# Patient Record
Sex: Female | Born: 1960 | Race: White | Hispanic: No | Smoking: Never smoker
Health system: Southern US, Community
[De-identification: ages and names within clinical notes are randomized; demographics above are authoritative.]

## PROBLEM LIST (undated history)

## (undated) DIAGNOSIS — N6019 Diffuse cystic mastopathy of unspecified breast: Secondary | ICD-10-CM

## (undated) DIAGNOSIS — G43909 Migraine, unspecified, not intractable, without status migrainosus: Secondary | ICD-10-CM

## (undated) DIAGNOSIS — R112 Nausea with vomiting, unspecified: Secondary | ICD-10-CM

## (undated) DIAGNOSIS — Z8619 Personal history of other infectious and parasitic diseases: Secondary | ICD-10-CM

## (undated) DIAGNOSIS — I7300 Raynaud's syndrome without gangrene: Secondary | ICD-10-CM

## (undated) DIAGNOSIS — Z9889 Other specified postprocedural states: Secondary | ICD-10-CM

## (undated) DIAGNOSIS — T8859XA Other complications of anesthesia, initial encounter: Secondary | ICD-10-CM

## (undated) HISTORY — DX: Diffuse cystic mastopathy of unspecified breast: N60.19

## (undated) HISTORY — DX: Personal history of other infectious and parasitic diseases: Z86.19

## (undated) HISTORY — PX: TOTAL ABDOMINAL HYSTERECTOMY: SHX209

## (undated) HISTORY — DX: Raynaud's syndrome without gangrene: I73.00

## (undated) HISTORY — PX: PELVIC LAPAROSCOPY: SHX162

## (undated) HISTORY — DX: Migraine, unspecified, not intractable, without status migrainosus: G43.909

---

## 1998-03-08 ENCOUNTER — Other Ambulatory Visit: Admission: RE | Admit: 1998-03-08 | Discharge: 1998-03-08 | Payer: Self-pay | Admitting: Obstetrics and Gynecology

## 1999-10-29 HISTORY — PX: HYSTEROSCOPY: SHX211

## 2000-05-06 ENCOUNTER — Encounter: Payer: Self-pay | Admitting: *Deleted

## 2000-05-06 ENCOUNTER — Ambulatory Visit (HOSPITAL_COMMUNITY): Admission: RE | Admit: 2000-05-06 | Discharge: 2000-05-06 | Payer: Self-pay | Admitting: *Deleted

## 2000-05-09 ENCOUNTER — Other Ambulatory Visit: Admission: RE | Admit: 2000-05-09 | Discharge: 2000-05-09 | Payer: Self-pay | Admitting: Obstetrics and Gynecology

## 2000-07-07 ENCOUNTER — Inpatient Hospital Stay (HOSPITAL_COMMUNITY): Admission: RE | Admit: 2000-07-07 | Discharge: 2000-07-09 | Payer: Self-pay | Admitting: Obstetrics and Gynecology

## 2001-10-07 ENCOUNTER — Emergency Department (HOSPITAL_COMMUNITY): Admission: EM | Admit: 2001-10-07 | Discharge: 2001-10-07 | Payer: Self-pay | Admitting: Emergency Medicine

## 2001-10-07 ENCOUNTER — Encounter: Payer: Self-pay | Admitting: Emergency Medicine

## 2001-11-26 ENCOUNTER — Ambulatory Visit (HOSPITAL_COMMUNITY): Admission: RE | Admit: 2001-11-26 | Discharge: 2001-11-26 | Payer: Self-pay | Admitting: Gastroenterology

## 2004-11-20 ENCOUNTER — Encounter: Admission: RE | Admit: 2004-11-20 | Discharge: 2004-11-20 | Payer: Self-pay | Admitting: General Surgery

## 2004-12-04 ENCOUNTER — Encounter: Admission: RE | Admit: 2004-12-04 | Discharge: 2004-12-04 | Payer: Self-pay | Admitting: General Surgery

## 2005-11-07 ENCOUNTER — Encounter: Admission: RE | Admit: 2005-11-07 | Discharge: 2005-11-07 | Payer: Self-pay | Admitting: Obstetrics and Gynecology

## 2006-11-26 ENCOUNTER — Encounter: Admission: RE | Admit: 2006-11-26 | Discharge: 2006-11-26 | Payer: Self-pay | Admitting: Obstetrics and Gynecology

## 2012-10-28 DIAGNOSIS — Z8619 Personal history of other infectious and parasitic diseases: Secondary | ICD-10-CM

## 2012-10-28 HISTORY — DX: Personal history of other infectious and parasitic diseases: Z86.19

## 2012-12-25 ENCOUNTER — Other Ambulatory Visit: Payer: Self-pay | Admitting: Physician Assistant

## 2012-12-25 DIAGNOSIS — R51 Headache: Secondary | ICD-10-CM

## 2012-12-30 ENCOUNTER — Inpatient Hospital Stay: Admission: RE | Admit: 2012-12-30 | Payer: Self-pay | Source: Ambulatory Visit

## 2013-01-05 ENCOUNTER — Other Ambulatory Visit: Payer: Self-pay

## 2013-01-07 ENCOUNTER — Ambulatory Visit
Admission: RE | Admit: 2013-01-07 | Discharge: 2013-01-07 | Disposition: A | Discharge status: Home / Self Care | Payer: No Typology Code available for payment source | Source: Ambulatory Visit | Attending: Physician Assistant | Admitting: Physician Assistant

## 2013-01-07 DIAGNOSIS — R51 Headache: Secondary | ICD-10-CM

## 2013-02-23 ENCOUNTER — Ambulatory Visit (INDEPENDENT_AMBULATORY_CARE_PROVIDER_SITE_OTHER): Payer: No Typology Code available for payment source | Admitting: Gynecology

## 2013-02-23 ENCOUNTER — Encounter: Payer: Self-pay | Admitting: Gynecology

## 2013-02-23 VITALS — BP 110/70 | HR 68 | Resp 16 | Ht 64.5 in | Wt 104.0 lb

## 2013-02-23 DIAGNOSIS — Z0000 Encounter for general adult medical examination without abnormal findings: Secondary | ICD-10-CM

## 2013-02-23 DIAGNOSIS — Z1211 Encounter for screening for malignant neoplasm of colon: Secondary | ICD-10-CM

## 2013-02-23 DIAGNOSIS — G43909 Migraine, unspecified, not intractable, without status migrainosus: Secondary | ICD-10-CM | POA: Insufficient documentation

## 2013-02-23 DIAGNOSIS — N6019 Diffuse cystic mastopathy of unspecified breast: Secondary | ICD-10-CM | POA: Insufficient documentation

## 2013-02-23 DIAGNOSIS — Z13820 Encounter for screening for osteoporosis: Secondary | ICD-10-CM

## 2013-02-23 DIAGNOSIS — Z01419 Encounter for gynecological examination (general) (routine) without abnormal findings: Secondary | ICD-10-CM

## 2013-02-23 DIAGNOSIS — I7300 Raynaud's syndrome without gangrene: Secondary | ICD-10-CM | POA: Insufficient documentation

## 2013-02-23 LAB — POCT URINALYSIS DIPSTICK
Blood, UA: NEGATIVE
Glucose, UA: NEGATIVE
Nitrite, UA: NEGATIVE
Protein, UA: NEGATIVE
Spec Grav, UA: 1.02
pH, UA: 6

## 2013-02-23 MED ORDER — ESTRADIOL 0.1 MG/24HR TD PTTW: 1.0000 | MEDICATED_PATCH | TRANSDERMAL | Status: DC

## 2013-02-23 NOTE — Progress Notes (Signed)
52 y.o.  Married  Caucasian female  G0  here for annual exam.  Pt is currently taking HRT, would like to continue.  Denies dyspareunia, reports regualr BSE but has had extensive fibrocystic disease with multiple drainage of cysts  No LMP recorded.          Sexually active: yes  The current method of family planning is post menopausal status.    Exercising: walk, weight Last mammogram: 10/2012  Last pap smear: History of abnormal pap: no Smoking: no Alcohol: no Last colonoscopy: never Last Bone Density:  2007 Last tetanus shot: 2012 Last cholesterol check:  BSE:  yes  Hgb:                Urine:    No health maintenance topics applied.  Family History  Problem Relation Age of Onset  . Parkinson's disease Father     Patient Active Problem List   Diagnosis Date Noted  . Migraine   . Raynaud's disease   . Fibrocystic breast disease     Past Medical History  Diagnosis Date  . Migraine     Past Surgical History  Procedure Laterality Date  . Hysteroscopy  2001    BSO- endometriosis  . Oophorectomy Bilateral     with TAH  . Pelvic laparoscopy  3x     endometriosis, LOA    Allergies: Codeine  No current outpatient prescriptions on file.   No current facility-administered medications for this visit.    ROS: Pertinent items are noted in HPI.  Social Hx:    Exam:    There were no vitals taken for this visit.   Wt Readings from Last 3 Encounters:  No data found for Wt     Ht Readings from Last 3 Encounters:  No data found for Ht    General appearance: alert, cooperative and appears stated age Head: Normocephalic, without obvious abnormality, atraumatic Neck: no adenopathy, supple, symmetrical, trachea midline and thyroid not enlarged, symmetric, no tenderness/mass/nodules Lungs: clear to auscultation bilaterally Breasts: Inspection negative, No nipple retraction or dimpling, No nipple discharge or bleeding, No axillary or supraclavicular adenopathy, Normal  to palpation without dominant masses Heart: regular rate and rhythm Abdomen: soft, non-tender; bowel sounds normal; no masses,  no organomegaly Extremities: extremities normal, atraumatic, no cyanosis or edema Skin: Skin color, texture, turgor normal. No rashes or lesions Lymph nodes: Cervical, supraclavicular, and axillary nodes normal. No abnormal inguinal nodes palpated Neurologic: Grossly normal   Pelvic: External genitalia:  no lesions              Urethra:  normal appearing urethra with no masses, tenderness or lesions              Bartholins and Skenes: normal                 Vagina: normal appearing vagina with normal color and discharge, no lesions              Cervix: absent              Pap taken: no        Bimanual Exam:  Uterus:  absent                                      Adnexa: absent but nodularity on right palpated  Rectovaginal: Confirms, no fullness on right                                      Anus:  normal sphincter tone, no lesions  A: normal gyn exam Severe fibrocystic breast disease Postmenopausal female, surgical, 13y on HRT     P: mammogram diagnostic due 7/14, last exam on right 01/2102, suggest pt get repeat mammo in 27m, pt has seen surgery in past, multiple drainage, reports no benefit with discontinuing caffiene, dsicussed 3d mammo counseled on use and side effects of HRT adequate intake of calcium and vitamin D, refill vivelle dot only, d/c progestin right sided fullness, no symptoms, pt will rto for recheck in 81m Overdue for colonoscopy-referral Last DEXA 7y ago-refer Will rto for fasting labs Suggest pt see gen surgeon re severe fibrocystic dz return annually or prn     An After Visit Summary was printed and given to the patient.

## 2013-02-23 NOTE — Patient Instructions (Addendum)

## 2013-02-25 ENCOUNTER — Telehealth: Payer: Self-pay | Admitting: Orthopedic Surgery

## 2013-02-25 ENCOUNTER — Telehealth: Payer: Self-pay | Admitting: Gynecology

## 2013-02-25 DIAGNOSIS — N6012 Diffuse cystic mastopathy of left breast: Secondary | ICD-10-CM

## 2013-02-25 NOTE — Telephone Encounter (Signed)
Called pt to discuss referral back to general surgeon, pt had seen Dr Maryagnes Amos in past but never follwed up with new surgeon after his retirement.  Pt has svereve fibrocystic breast disease and has had multiple imaging studies, I recommend she discuss alternative therapies with GS, cyst are recurrent, painful and multpile recalls are stressful. Questions addressed, she is agreeable and we will make that referral for her

## 2013-02-25 NOTE — Telephone Encounter (Signed)
LMTCB    (Need to tell her about 2 upcoming appts. Dr. Loreta Ave consult 03-02-13 at 2:45 pm. Dr. Derrell Lolling consult at [facility] 03-08-13 arriving at 4:30 for a 5:00 appt.)

## 2013-02-26 ENCOUNTER — Other Ambulatory Visit: Payer: Self-pay | Admitting: Obstetrics and Gynecology

## 2013-02-26 NOTE — Telephone Encounter (Signed)
Patient wants advice from Dr. Tresa Res re: progesterone. Dr. Farrel Gobble took patient off of it and patient wants Dr. Harlene Salts advice and wants to go back on it. Patient requests rx for progesterone be called into [facility] at [facility]/ Patient also wants to speak with Dr. Tresa Res about Dr. Farrel Gobble suggesting patient have a double mastectomy because of "lumpy breasts" patient has had for years. Patient states her schedule does not permit her to come in for a consultation but does want Dr. Harlene Salts advice.

## 2013-02-26 NOTE — Telephone Encounter (Signed)
I will need her paper chart.  Please put it on my desk.

## 2013-02-26 NOTE — Telephone Encounter (Signed)
Please advise on note for refill and patient question.

## 2013-03-02 NOTE — Telephone Encounter (Signed)
Filled on 02/23/13

## 2013-03-03 ENCOUNTER — Telehealth: Payer: Self-pay | Admitting: Obstetrics and Gynecology

## 2013-03-03 NOTE — Telephone Encounter (Signed)
Called numbers in EPIC. Left message on home # of spouse home # listed of need to return call to our office with response of Dr.   Tresa Res. And referrals.

## 2013-03-03 NOTE — Telephone Encounter (Signed)
Dr. Farrel Gobble was trying to be highly careful re: breast cancer PREVENTION by sending pt for a surgical opinion.  I think it would be ok to forgo the surgical referral for now and just do good self exams, have me check her breasts every year, and get yearly mammograms.  She certainly does need the referral for colonoscopy for screening.

## 2013-03-03 NOTE — Telephone Encounter (Signed)
Patient called concerning phone call as to why she needed to see a surgeon when she does not have breast cancer.  See OV note for 02/23/2013 with Dr. Farrel Gobble. Patient states she never received any messages of appointment date and time for Dr. Loreta Ave or for [facility]. Patient states she does not want to see any surgeon till you have reviewed her chart and have your in put on this matter. Please advise. Fannie Knee. Unable to loacate paper chart . Telephone note printed for 02/25/2013. In your cabinet.

## 2013-03-04 ENCOUNTER — Telehealth: Payer: Self-pay | Admitting: Orthopedic Surgery

## 2013-03-04 NOTE — Telephone Encounter (Signed)
Pt missed screening colonoscopy consult appt on 03-02-13  because she could not be contacted. Got another appt from scheduler for 03-16-13 at 1:30 pm.

## 2013-03-04 NOTE — Telephone Encounter (Signed)
LMTCB about upcoming screening colonoscopy appt.

## 2013-03-05 ENCOUNTER — Ambulatory Visit: Payer: Self-pay | Admitting: Obstetrics and Gynecology

## 2013-03-05 NOTE — Telephone Encounter (Signed)
Patient called and was given information from Dr. Tresa Res regarding surgical referral and colonoscopy. Patient understands this and has called and gotten a colonoscopy consult set up.

## 2013-03-05 NOTE — Telephone Encounter (Signed)
Patient returning Sue's call.

## 2013-03-08 ENCOUNTER — Ambulatory Visit (INDEPENDENT_AMBULATORY_CARE_PROVIDER_SITE_OTHER): Payer: Self-pay | Admitting: General Surgery

## 2013-03-09 ENCOUNTER — Telehealth: Payer: Self-pay | Admitting: Orthopedic Surgery

## 2013-03-09 NOTE — Telephone Encounter (Signed)
LM on pt cell phone about appt at Dr. Kenna Gilbert office 03-16-13 at 1:30 pm for consult. Gave phone number to reschedule if needed. Pt to call back if any questions.

## 2013-03-17 ENCOUNTER — Other Ambulatory Visit: Payer: Self-pay | Admitting: Obstetrics and Gynecology

## 2013-03-18 NOTE — Telephone Encounter (Signed)
Patient seen on 02/23/13 ok to fill for Prometrium?

## 2013-03-19 ENCOUNTER — Other Ambulatory Visit: Payer: Self-pay | Admitting: Obstetrics and Gynecology

## 2013-03-19 NOTE — Telephone Encounter (Signed)
Dr. Farrel Gobble saw her on 02/23/13 ok to fill no current lab result or fill done during annual.

## 2013-04-07 ENCOUNTER — Telehealth: Payer: Self-pay | Admitting: *Deleted

## 2013-04-07 MED ORDER — PROGESTERONE MICRONIZED 100 MG PO CAPS: 100.0000 mg | ORAL_CAPSULE | Freq: Every day | ORAL | Status: DC

## 2013-04-07 NOTE — Telephone Encounter (Signed)
Per paper chart, was previously on Minivelle 0.1mg  twice week and Prometrium 100 mg OD (from CR annual 01-2012).  Saw DrLathrop 01-2013 and Prometrium was discontinued History of endometriosis and hysterectomy.

## 2013-04-07 NOTE — Telephone Encounter (Signed)
Please renew for patient through AEX.  She may need to see me next year.  Can you clarify why she's had trouble getting a response?

## 2013-04-07 NOTE — Telephone Encounter (Signed)
Patient states she has called multiple times without response.  Wants to restart Progesterone that Dr Farrel Gobble took her off of.  She had been on it for years from dr Tresa Res and did great but dr Farrel Gobble took her off and now she is having trouble with migraines and difficulty sleeping.  Please advise if ok to restart

## 2013-04-07 NOTE — Telephone Encounter (Signed)
Patient notified RX will be sent to pharmacy till next AEX.  Notified of Dr [employer] and will sched AEX with Dr Hyacinth Meeker.

## 2013-07-26 ENCOUNTER — Telehealth: Payer: Self-pay | Admitting: Obstetrics & Gynecology

## 2013-07-26 NOTE — Telephone Encounter (Signed)
pt was referred by Dr. Farrel Gobble and has an appointment tomorrow @ [facility] they need to know where she had her mammograms done at and she also needs notes.

## 2013-07-27 ENCOUNTER — Ambulatory Visit (INDEPENDENT_AMBULATORY_CARE_PROVIDER_SITE_OTHER): Payer: No Typology Code available for payment source | Admitting: General Surgery

## 2013-07-27 ENCOUNTER — Encounter (INDEPENDENT_AMBULATORY_CARE_PROVIDER_SITE_OTHER): Payer: Self-pay | Admitting: General Surgery

## 2013-07-27 VITALS — BP 110/72 | HR 82 | Temp 98.2°F | Resp 14 | Ht 65.0 in | Wt 106.6 lb

## 2013-07-27 DIAGNOSIS — N6019 Diffuse cystic mastopathy of unspecified breast: Secondary | ICD-10-CM

## 2013-07-27 NOTE — Progress Notes (Signed)
Patient ID: Shannon Foster, female   DOB: 1961-04-16, 52 y.o.   MRN: [id number]  Chief Complaint  Patient presents with  . New Evaluation    eval lt br fibrocystic disease for poss masty    HPI Shannon Foster is a 52 y.o. female.  She is referred by Dr. Douglass Rivers for evaluation of bilateral fibrocystic disease.  This patient is generally healthy. She has a long history of bilateral breast cysts. Dr. Maryagnes Amos aspirated these numerous times in the past. She has not had any breast cyst aspirations lately. She has seen Dr. Luisa Hart on one occasion for fibrocystic changes well. She does not have any pain. She is not having nipple discharge. She states that she has lumps that come and go but none that are progressive or symptomatic.  Imaging studies show that her breasts are very dense and there are stable, diffuse, presumably benign calcifications in both breasts. Her last mammogram dated 11/11/2012 showed that there was a cluster of microcalcifications in the upper-outer quadrant left breast which were thought to be stable. Six-month followup was recommended. When they called she declined the six-month followup. She is concerned about radiation exposure and the numerous mammograms and she has received. She has never had a breast biopsy.  She states that she and Dr. Farrel Gobble have discussed whether or not prophylactic mastectomy would be a good option. She is not interested in mastectomy unless it is indicated. She does not appear to be fearful or phobic regarding occult breast cancer, but does want to know what the right thing to do this.  Family history is negative for breast cancer or ovarian cancer. This includes her mother, grandmother, and 2 sisters.  Past history is negative for radiation therapy. She takes progesterone, estradiol, Prilosec, Elavil, and Maxalt for migraines.  HPI  Past Medical History  Diagnosis Date  . Migraine   . Raynaud's disease   . Fibrocystic breast disease      Past Surgical History  Procedure Laterality Date  . Hysteroscopy  2001    BSO- endometriosis  . Oophorectomy Bilateral     with TAH  . Pelvic laparoscopy  3x     endometriosis, LOA  . Abdominal hysterectomy      Family History  Problem Relation Age of Onset  . Parkinson's disease Father   . Hypertension Mother   . Cancer Brother     prostate    Social History History  Substance Use Topics  . Smoking status: Never Smoker   . Smokeless tobacco: Never Used  . Alcohol Use: No    Allergies  Allergen Reactions  . Codeine Nausea And Vomiting    Current Outpatient Prescriptions  Medication Sig Dispense Refill  . amitriptyline (ELAVIL) 50 MG tablet       . estradiol (MINIVELLE) 0.1 MG/24HR Place 1 patch (0.1 mg total) onto the skin 2 (two) times a week.  8 patch  12  . omeprazole (PRILOSEC) 40 MG capsule       . progesterone (PROMETRIUM) 100 MG capsule Take 1 capsule (100 mg total) by mouth at bedtime.  90 capsule  3  . Vitamin D, Ergocalciferol, (DRISDOL) 50000 UNITS CAPS TAKE 1 CAPSULE BY MOUTH EVERY OTHER WEEK  26 capsule  0  . rizatriptan (MAXALT) 10 MG tablet        No current facility-administered medications for this visit.    Review of Systems Review of Systems  Constitutional: Negative for fever, chills and unexpected weight change.  HENT:  Negative for hearing loss, congestion, sore throat, trouble swallowing and voice change.   Eyes: Negative for visual disturbance.  Respiratory: Negative for cough and wheezing.   Cardiovascular: Negative for chest pain, palpitations and leg swelling.  Gastrointestinal: Negative for nausea, vomiting, abdominal pain, diarrhea, constipation, blood in stool, abdominal distention and anal bleeding.  Genitourinary: Negative for hematuria, vaginal bleeding and difficulty urinating.  Musculoskeletal: Negative for arthralgias.  Skin: Negative for rash and wound.  Neurological: Negative for seizures, syncope and headaches.   Hematological: Negative for adenopathy. Does not bruise/bleed easily.  Psychiatric/Behavioral: Negative for confusion.    Blood pressure 110/72, pulse 82, temperature 98.2 F (36.8 C), temperature source Temporal, resp. rate 14, height 5\' 5"  (1.651 m), weight 106 lb 9.6 oz (48.353 kg).  Physical Exam Physical Exam  Constitutional: She is oriented to person, place, and time. She appears well-developed and well-nourished. No distress.  10. Healthy appearing. Alert.  HENT:  Head: Normocephalic and atraumatic.  Nose: Nose normal.  Mouth/Throat: No oropharyngeal exudate.  Eyes: Conjunctivae and EOM are normal. Pupils are equal, round, and reactive to light. Left eye exhibits no discharge. No scleral icterus.  Neck: Neck supple. No JVD present. No tracheal deviation present. No thyromegaly present.  Cardiovascular: Normal rate, regular rhythm, normal heart sounds and intact distal pulses.   No murmur heard. Pulmonary/Chest: Effort normal and breath sounds normal. No respiratory distress. She has no wheezes. She has no rales. She exhibits no tenderness.  Breasts are generally small. Symmetrical. Diffusely lumpy with a few smoothly marginated rounded  Masses. this is consistent with cysts. Nipple, areola, and skin is healthy. No axillary adenopathy. I did not perform ultrasound today.  Abdominal: Soft. Bowel sounds are normal. She exhibits no distension and no mass. There is no tenderness. There is no rebound and no guarding.  Musculoskeletal: She exhibits no edema and no tenderness.  Lymphadenopathy:    She has no cervical adenopathy.  Neurological: She is alert and oriented to person, place, and time. She exhibits normal muscle tone. Coordination normal.  Skin: Skin is warm. No rash noted. She is not diaphoretic. No erythema. No pallor.  Psychiatric: She has a normal mood and affect. Her behavior is normal. Judgment and thought content normal.  Good insight.    Data Reviewed Recent office  notes. Old office notes. Recent imaging studies.  Assessment    Fibrocystic breast disease, with a long history of multiple macrocysts.  Dense breast, this is an independent risk factor for breast cancer and slightly increases her risk   No other significant risk factors for breast cancer, although she is taking hormone replacement therapy    Plan    There is no indication for breast surgery at this point in time.  The biggest issue that I discussed with her was the difficulty in screening by physical exam and mammography because of her dense breast tissue. She is aware that her breast density is a risk factor for breast cancer.  I advised her to have mammography and bilateral breast MRIs sometime between now and January. She stated that she would do this in January.  She'll return to see me after the imaging studies are done in January for a breast exam and a discussion of findings.   She seems quite comfortable with this approach at this time.       Angelia Mould. Derrell Lolling, M.D., [facility] General and Minimally invasive Surgery Breast and Colorectal Surgery Office:   [phone] Pager:  [phone]  07/27/2013, 12:31 PM

## 2013-07-27 NOTE — Telephone Encounter (Signed)
Info faxed to [facility] for appt. Latest MMG's done at [facility].

## 2013-07-27 NOTE — Patient Instructions (Signed)
You have fibrocystic breast disease. The lumps that come and go are macrocysts which contained fluid.  Her mammogram shows which her breast tissue is very dense, and you have a diffuse benign appearing calcifications.  The dense breast tissue seen on mammogram is a slight risk factor for breast cancer, but he did not appear to have any other significant risks.  There is no indication for breast biopsy or other breast surgery at this point in time.  The biggest issue is the difficulty in screening for breast cancer considering your difficult physical exam and dense breasts on mammogram.  Dr. Derrell Lolling recommends that you get mammograms and  bilateral breast MRI in January.  Return to see Dr. Derrell Lolling in January.      Fibrocystic Breast Changes Fibrocystic breast changes is a non-cancerous(benign) condition that about half of all women have at some time in their life. It is also called benign breast disease and mammary dysplasia. It may also be called fibrocystic breast disease, but it is not really a disease. It is a common condition that occurs when women go through the hormonal changes during their menstrual cycle, between the ages of 42 to 22. Menopausal women do not have this problem, unless they are on hormone therapy. It can affect one or both breasts. This is not a sign that you will later get cancer. CAUSES  Overgrowth of cells lining the milk ducts, or enlarged lobules in the breast, cause the breast duct to become blocked. The duct then fills up with fluid. This is like a small balloon filled with water. It is called a cyst. Over time, with repeated inflammation there is a tendency to form scar tissue. This scar tissue becomes the fibrous part of fibrocystic disease. The exact cause of this happening is not known, but it may be related to the female hormones, estrogen and progesterone. Heredity (genetics) may also be a factor in some cases. SYMPTOMS   Tenderness.  Swelling.  Rope-like  feeling.  Lumpy breast, one or both sides.  Changes in the size of the breasts, before and after the menstrual period (larger before, smaller after).  Green or dark brown nipple discharge (not blood). Symptoms are usually worse before periods (menstrual cycle) and get better toward the end of menstruation. Usually, it is temporary minor discomfort. But some women have severe pain.  DIAGNOSIS  Check your breasts monthly. The best time to check your breasts is after your period. If you check them during your period, you are more likely to feel the normal glands enlarged, as a result of the hormonal changes that happen right before your period. If you do not have menstrual periods, check your breasts the first day of every month. Become familiar with the way your own breasts feel. It is then easier to notice if there are changes, such as more tenderness, a new growth, change in breast size, or a change in a lump that has always been there. All breasts lumps need to be investigated, to rule out breast cancer. See your caregiver as soon as possible, if you find a lump. Most breast lumps are not cancerous. Excellent treatment is available for ones that are.  To make a diagnosis, your caregiver will examine your breasts and may recommend other tests, such as:  Mammogram (breast X-ray).  Ultrasound.  MRI (magnetic resonance imaging).  Removing fluid from the cyst with a fine needle, under local anesthesia (aspiration).  Taking a breast tissue sample (breast biopsy). Some questions your caregiver  will ask are:  What was the date of your last period?  When did the lump show up?  Is there any discharge from your breast?  Is the breast tender or painful?  Are the symptoms in one or both breasts?  Has the lump changed in size from month-to-month? How long has it been present?  Any family history of breast problems?  Any past breast problems?  Any history of breast surgery?  Are you taking  any medications?  When was your last mammogram, and where was it done? TREATMENT   Dietary changes help to prevent or reduce the symptoms of fibrocystic breast changes.  You may need to stop consuming all foods that contain caffeine, such as chocolate, sodas, coffee, and tea.  Reducing sugar and fat in your diet may also help.  Decrease estrogen in your diet. Some sources include commercially raised meats which contain estrogen. Eliminate other natural estrogens.  Birth control pills can also make symptoms worse.  Natural progesterone cream, applied at a dose of 15 to 20 milligrams per day, from ovulation until a day or two before your period returns, may help with returning to normal breast tissue over several months. Seek advice from your caregiver.  Over-the-counter pain pills may help, as recommended by your caregiver.  Danazol hormone (female-like hormone) is sometimes used. It may cause hair growth and acne.  Needle aspiration can be used, to remove fluid from the cyst.  Surgery may be needed, to remove a large, persistent, and tender cyst.  Evening primrose oil may help with the tenderness and pain. It has linolenic acid that women may not have enough of. HOME CARE INSTRUCTIONS   Examine your breasts after every menstrual period.  If you do not have menstrual periods, examine your breasts the first day of every month.  Wear a firm support bra, especially when exercising.  Decrease or avoid caffeine in your diet.  Decrease the fat and sugar in your diet.  Eat a balanced diet.  Try to see your caregiver after you have a menstrual period.  Before seeing your caregiver, make notes about:  When you have the symptoms.  What types of symptoms you are having.  Medications you are taking.  When and where your last mammogram was taken.  Past breast problems or breast surgery. SEEK MEDICAL CARE IF:   You have been diagnosed with fibrocystic breast changes, and you  develop changes in your breast:  Discharge from the nipple, especially bloody discharge.  Pain in the breast that does not go away after your menstrual period.  New lumps or bumps in the breast.  Lumps in your armpit.  Your breast or breasts become enlarged, red, and painful.  You find an isolated lump, even if it is not tender.  You have questions about this condition that have not been answered. Document Released: 07/31/2006 Document Revised: 01/06/2012 Document Reviewed: 10/25/2009 [facility] Patient Information 2014 [place], [place].

## 2013-08-04 ENCOUNTER — Encounter (INDEPENDENT_AMBULATORY_CARE_PROVIDER_SITE_OTHER): Payer: Self-pay

## 2013-08-18 ENCOUNTER — Encounter (INDEPENDENT_AMBULATORY_CARE_PROVIDER_SITE_OTHER): Payer: Self-pay

## 2013-10-13 ENCOUNTER — Encounter (INDEPENDENT_AMBULATORY_CARE_PROVIDER_SITE_OTHER): Payer: Self-pay | Admitting: General Surgery

## 2013-11-15 ENCOUNTER — Other Ambulatory Visit (INDEPENDENT_AMBULATORY_CARE_PROVIDER_SITE_OTHER): Payer: Self-pay

## 2013-11-15 ENCOUNTER — Telehealth (INDEPENDENT_AMBULATORY_CARE_PROVIDER_SITE_OTHER): Payer: Self-pay | Admitting: *Deleted

## 2013-11-15 ENCOUNTER — Other Ambulatory Visit: Payer: Self-pay | Admitting: Ophthalmology

## 2013-11-15 DIAGNOSIS — G44009 Cluster headache syndrome, unspecified, not intractable: Secondary | ICD-10-CM

## 2013-11-15 DIAGNOSIS — N6019 Diffuse cystic mastopathy of unspecified breast: Secondary | ICD-10-CM

## 2013-11-15 NOTE — Addendum Note (Signed)
Addended byGweneth Fritter on: 11/15/2013 03:06 PM   Modules accepted: Orders

## 2013-11-15 NOTE — Telephone Encounter (Signed)
I spoke with pt and informed her of appt for her MM at [facility] on 11/17/13 with an arrival time of 2:30pm.  I also informed her of the appt for her breast MRI at [id number] on 11/18/13 with an arrival time of 7:15am.  I provided her with the phone numbers of [facility] and [facility].  I also informed her of her follow up appt with Dr. Dalbert Batman on 12/21/13 with an arrival time of 10:15am. She is agreeable with all information provided.

## 2013-11-18 ENCOUNTER — Other Ambulatory Visit: Payer: No Typology Code available for payment source

## 2013-11-24 ENCOUNTER — Other Ambulatory Visit: Payer: No Typology Code available for payment source

## 2013-11-24 ENCOUNTER — Inpatient Hospital Stay: Admission: RE | Admit: 2013-11-24 | Payer: No Typology Code available for payment source | Source: Ambulatory Visit

## 2013-12-21 ENCOUNTER — Ambulatory Visit (INDEPENDENT_AMBULATORY_CARE_PROVIDER_SITE_OTHER): Payer: No Typology Code available for payment source | Admitting: General Surgery

## 2013-12-27 ENCOUNTER — Telehealth: Payer: Self-pay | Admitting: *Deleted

## 2013-12-27 NOTE — Telephone Encounter (Signed)
Recall update:  Patient overdue for follow up MMG, Please contact her to schedule and notify me of date.

## 2013-12-28 NOTE — Telephone Encounter (Signed)
Left Message To Call Back  

## 2013-12-31 NOTE — Telephone Encounter (Signed)
Left Message To Call Back  

## 2014-01-04 NOTE — Telephone Encounter (Signed)
Left Message To Call Back  

## 2014-01-05 NOTE — Telephone Encounter (Signed)
Called patient x3 times and left messages no call back  Next step?

## 2014-01-11 NOTE — Telephone Encounter (Signed)
Have you received response?

## 2014-01-12 ENCOUNTER — Encounter: Payer: Self-pay | Admitting: Obstetrics & Gynecology

## 2014-01-14 NOTE — Telephone Encounter (Signed)
Letter sent per Dr Sabra Heck and recalls completed.  Routing to provider for final review. Patient agreeable to disposition. Will close encounter

## 2014-01-17 NOTE — Telephone Encounter (Signed)
Agree.  Encounter closed. 

## 2014-03-02 ENCOUNTER — Ambulatory Visit: Payer: No Typology Code available for payment source | Admitting: Gynecology

## 2014-03-03 ENCOUNTER — Encounter: Payer: Self-pay | Admitting: Obstetrics & Gynecology

## 2014-03-03 ENCOUNTER — Ambulatory Visit: Payer: Self-pay | Admitting: Obstetrics & Gynecology

## 2014-03-07 ENCOUNTER — Other Ambulatory Visit: Payer: Self-pay | Admitting: Gynecology

## 2014-03-07 NOTE — Telephone Encounter (Signed)
eScribe request from [facility] for refill on MINIVELLE Last filled - 02/23/13, #8 X 12 Last AEX - 02/23/13 Next AEX - not scheduled.  Pt no showed appt on 03/03/14 with Dr. Sabra Heck.  Last MMG - 11/11/12 per Dr Dalbert Batman note on 07/27/13.  Pt did not keep breast MRI appt on 11/18/13 that was scheduled by Dr. Darrel Hoover office.  Please advise refills.

## 2014-03-10 NOTE — Telephone Encounter (Signed)
Patient spouse is calling about the refill says that we can call her back at [phone] it is her cell phone number. Has aex scheduled for April 08, 2014

## 2014-03-11 NOTE — Telephone Encounter (Signed)
No refill if does not keep annual 6/15

## 2014-04-08 ENCOUNTER — Ambulatory Visit: Payer: Self-pay | Admitting: Obstetrics & Gynecology

## 2014-04-11 ENCOUNTER — Other Ambulatory Visit: Payer: Self-pay | Admitting: Obstetrics & Gynecology

## 2014-04-12 NOTE — Telephone Encounter (Signed)
Patient canceled and/or Nor show 3 times. Last refill 03/07/14. Last appt canceled 04/08/14. No more refills per Dr. Brion Aliment note.

## 2014-04-13 ENCOUNTER — Other Ambulatory Visit: Payer: Self-pay | Admitting: Gynecology

## 2014-04-13 ENCOUNTER — Other Ambulatory Visit: Payer: Self-pay | Admitting: Obstetrics & Gynecology

## 2014-04-13 ENCOUNTER — Telehealth: Payer: Self-pay | Admitting: Obstetrics & Gynecology

## 2014-04-13 NOTE — Telephone Encounter (Signed)
Please notify pt that I cannot refill RXs until she is seen.  She was advised to see Dr. Dalbert Batman for consult and didn't go.  They wrote her a letter regarding this.  She didn't go for her follow up MMG and I wrote her a letter about this.  Then she missed her June appointment.  I cannot write her prescription again until 1) she is seen in our office and 2) has her MMG.  She can be seen by anybody in the office and she can call and schedule her MMG as soon as her schedule allows.  I am sorry.

## 2014-04-13 NOTE — Telephone Encounter (Signed)
Patient's husband is calling regarding his wife's prescriptions. He said [facility] sent over a request and wife is out of her prescriptions could they be approved today. See refill requests.

## 2014-04-13 NOTE — Telephone Encounter (Signed)
Pt notified of Dr. Ammie Ferrier response.  Pt is very upset.  Pt states she was told last week when she called to reschedule appt, that getting a refill on her medication would not be an issue, she just needed to have her pharmacy call us.  Also discussed with patient that she is overdue for mammogram.  Pt states she went to Dr. Dalbert Batman and he agreed that she does not need frequent mammograms.  I read Dr. Ammie Ferrier note to patient that pt must be seen in our office and have mammogram before she can have refill.  Advised that she can see anyone in office.  Pt states mammogram can be done and she is going to discuss mammogram at time of AEX.  Pt states she will expect to see refills at pharmacy as she was told last week that getting refills would not be a problem and "this is not something to play with".  Advised pt that I could not make that decision and would have to discuss with someone above me.  Advised it could be Thursday before we contact her.  Pt is agreeable.  Discussed with Gay Filler and Dr. Quincy Simmonds and patient will be called Thursday morning.

## 2014-04-13 NOTE — Telephone Encounter (Signed)
eScribe request from [facility] for refill on [place] filled - 03/11/14, #8 X 0.  Per Dr. Charlies Constable no further refills if pt does not keep 03/2014 appt. Last AEX - 02/23/13 Next AEX - 08/05/14 Last MMG - 11/11/12, referred to Dr. Dalbert Batman for fibrocystic disease, but has not followed up as requested by him.  Pt DNKA on 03/03/14 to see Dr. Sabra Heck, pt cancelled 04/08/14 appt with Dr. Sabra Heck.   Please advise refills.

## 2014-04-13 NOTE — Telephone Encounter (Signed)
eScribe request from [facility] for refill on [place] Last filled - 04/07/13, #90 X 3 Last AEX - 02/23/13 Next AEX - 08/05/14 Last MMG - 11/11/12, referred to Dr. Dalbert Batman for fibrocystic disease, but has not followed up as requested by him.  Pt DNKA on 03/03/14 to see Dr. Sabra Heck, pt cancelled 04/08/14 appt with Dr. Sabra Heck.   Please advise refills.

## 2014-04-14 ENCOUNTER — Telehealth: Payer: Self-pay | Admitting: Obstetrics & Gynecology

## 2014-04-14 NOTE — Telephone Encounter (Signed)
Pt's husband calling back today saying [facility] [address] still does not have pt's refills.

## 2014-04-14 NOTE — Telephone Encounter (Signed)
Returning a call to sally. °

## 2014-04-14 NOTE — Telephone Encounter (Signed)
Call to patient, LMTCB on both numbers.  Reviewed all info from last night call with Dr Quincy Simmonds (Dr Sabra Heck was out of office last night when patient spoke with Colletta Maryland, see previous call). Patient has not had follow up breast imaging recommended and is overdue for AEX. W cannot refill until seen but she may see any Jorgia Manthei and we can work her in with NP next week.

## 2014-04-14 NOTE — Telephone Encounter (Signed)
Call to patient. Explained unable to provide refill until AEX and MD review due to need for breast follow-up and overdue for AEX.  We are willing work with patient to see any of the physicians but should stay with a physician due to on-going monitoring of breast issues.  AEX scheduled for 03-18-14 at 7am with Dr Quincy Simmonds.   Routing to Yuridia Couts for final review. Patient agreeable to disposition. Will close encounter

## 2014-04-14 NOTE — Telephone Encounter (Signed)
Returned call to patient on 04/13/14 and documentation in Refill note on 04/13/14. Closing this note.

## 2014-04-14 NOTE — Telephone Encounter (Signed)
Please have [facility] send any mammogram they have from 2014 and 2015.  I see none in Epic.  Has an appointment with me on Monday, June 22.

## 2014-04-18 ENCOUNTER — Ambulatory Visit (INDEPENDENT_AMBULATORY_CARE_PROVIDER_SITE_OTHER): Payer: BC Managed Care – PPO | Admitting: Obstetrics and Gynecology

## 2014-04-18 ENCOUNTER — Encounter: Payer: Self-pay | Admitting: Obstetrics and Gynecology

## 2014-04-18 VITALS — BP 102/70 | HR 84 | Ht 64.5 in | Wt 108.6 lb

## 2014-04-18 DIAGNOSIS — Z0000 Encounter for general adult medical examination without abnormal findings: Secondary | ICD-10-CM

## 2014-04-18 DIAGNOSIS — E559 Vitamin D deficiency, unspecified: Secondary | ICD-10-CM

## 2014-04-18 DIAGNOSIS — Z01419 Encounter for gynecological examination (general) (routine) without abnormal findings: Secondary | ICD-10-CM

## 2014-04-18 LAB — POCT URINALYSIS DIPSTICK
BILIRUBIN UA: NEGATIVE
Glucose, UA: NEGATIVE
Ketones, UA: NEGATIVE
Leukocytes, UA: NEGATIVE
Nitrite, UA: NEGATIVE
PH UA: 5
PROTEIN UA: NEGATIVE
RBC UA: NEGATIVE
Urobilinogen, UA: NEGATIVE

## 2014-04-18 MED ORDER — PROGESTERONE MICRONIZED 100 MG PO CAPS: 100.0000 mg | ORAL_CAPSULE | Freq: Every day | ORAL | Status: DC

## 2014-04-18 MED ORDER — ESTRADIOL 0.1 MG/24HR TD PTTW: 1.0000 | MEDICATED_PATCH | TRANSDERMAL | Status: DC

## 2014-04-18 NOTE — Patient Instructions (Signed)

## 2014-04-18 NOTE — Progress Notes (Signed)
Patient scheduled for 3D Dx bilateral mammogram with bilateral breast US at [facility] for 04/20/14 at 0830. Agreeable to time/date/location.

## 2014-04-18 NOTE — Progress Notes (Signed)
Patient ID: Shannon Foster, female   DOB: [birth year], 53 y.o.   MRN: [id number] GYNECOLOGY VISIT  PCP:   Baruch Goldmann, MD  Referring provider:   HPI: 53 y.o.   Married  Caucasian  female   G0P0 with No LMP recorded. Patient has had a hysterectomy.   here for AEX.    Stopped taking progesterone last year and developed migraine headaches of increased frequency.  Returned to the Prometrium sporadically.  Ran out.   Was sent to a general surgeon last year to consult for fibrocystic disease and discuss mastectomy.  Used to have aspiration by Dr. Tammi Klippel in past.  Saw Dr. Leonel Ramsay who recommended a breast MRI. Did not follow through because she was supposed to have a mammogram at the same time.  Concerned about radiation exposure.  Usually goes to [facility].  Does self breast exams and the lumps always come and go.  Off Prometrium for two weeks.  Was sporadic prior to this. Has had many more headaches since doing this.  Off Estrogen for one week.  [facility], now sees PCP.   Had a bout of shingles of the left forehead.  Taking Vit D 50,000 two tablets per month.   Hgb:    Urine:  Neg  GYNECOLOGIC HISTORY: No LMP recorded. Patient has had a hysterectomy. Sexually active:  yes Partner preference: female Contraception:  TAH/BSO  Menopausal hormone therapy: Minivelle and Prometrium DES exposure:  no  Blood transfusions: no Sexually transmitted diseases:   no GYN procedures and prior surgeries:  TAH/BSO Last mammogram:   01/2012 fibrocystic changes and calcifications of concern in left breast in Left UOQ--recommended f/u in 6 mo.Pt. Seen by surgeon 06/2013 for evaluation and recommended possible MRI of breast but so far pt. Has not done[facility]              Last pap and high risk HPV testing: 2001 wnl   History of abnormal pap smear:  no   OB History   Grav Para Term Preterm Abortions TAB SAB Ect Mult Living   0                 LIFESTYLE: Exercise:  walking             Tobacco: no Alcohol:    no Drug use:  no  OTHER HEALTH MAINTENANCE: Tetanus/TDap:   2013 Gardisil:              n/a Influenza:            never Zostavax:           n/a  Bone density:      2007 [facility] Colonoscopy:      10/2013 normal with Dr.Mann.  Next colonoscopy due 2025.  Cholesterol check: wnl   Family History  Problem Relation Age of Onset  . Parkinson's disease Father   . Hypertension Mother   . Cancer Brother     prostate    Patient Active Problem List   Diagnosis Date Noted  . Migraine   . Raynaud's disease   . Fibrocystic breast disease    Past Medical History  Diagnosis Date  . Migraine   . Raynaud's disease   . Fibrocystic breast disease     Past Surgical History  Procedure Laterality Date  . Hysteroscopy  2001    BSO- endometriosis  . Oophorectomy Bilateral     with TAH  . Pelvic laparoscopy  3x     endometriosis, LOA  . Abdominal hysterectomy      ALLERGIES: Codeine  Current Outpatient Prescriptions  Medication Sig Dispense Refill  . amitriptyline (ELAVIL) 50 MG tablet       . MINIVELLE 0.1 MG/24HR patch PLACE ONE PATCH ONTO THE SKIN 2 TIMES A WEEK  8 patch  0  . rizatriptan (MAXALT) 10 MG tablet       . Vitamin D, Ergocalciferol, (DRISDOL) 50000 UNITS CAPS TAKE 1 CAPSULE BY MOUTH EVERY OTHER WEEK  26 capsule  0  . progesterone (PROMETRIUM) 100 MG capsule Take 1 capsule (100 mg total) by mouth at bedtime.  90 capsule  3   No current facility-administered medications for this visit.     ROS:  Pertinent items are noted in HPI.  SOCIAL HISTORY:  Married.  PHYSICAL EXAMINATION:    BP 102/70  Pulse 84  Ht 5' 4.5" (1.638 m)  Wt 108 lb 9.6 oz (49.261 kg)  BMI 18.36 kg/m2   Wt Readings from Last 3 Encounters:  04/18/14 108 lb 9.6 oz (49.261 kg)  07/27/13 106 lb 9.6 oz (48.353 kg)  02/23/13 104 lb (47.174 kg)     Ht Readings from Last 3 Encounters:  04/18/14 5' 4.5" (1.638 m)   07/27/13 5\' 5"  (1.651 m)  02/23/13 5' 4.5" (1.638 m)    General appearance: alert, cooperative and appears stated age Head: Normocephalic, without obvious abnormality, atraumatic Neck: no adenopathy, supple, symmetrical, trachea midline and thyroid not enlarged, symmetric, no tenderness/mass/nodules Lungs: clear to auscultation bilaterally Breasts: Inspection negative, No nipple retraction or dimpling, No nipple discharge or bleeding, No axillary or supraclavicular adenopathy, Normal to palpation without dominant masses Heart: regular rate and rhythm Abdomen: soft, non-tender; no masses,  no organomegaly Extremities: extremities normal, atraumatic, no cyanosis or edema Skin: Skin color, texture, turgor normal. No rashes or lesions Lymph nodes: Cervical, supraclavicular, and axillary nodes normal. No abnormal inguinal nodes palpated Neurologic: Grossly normal  Pelvic: External genitalia:  no lesions              Urethra:  normal appearing urethra with no masses, tenderness or lesions              Bartholins and Skenes: normal                 Vagina: normal appearing vagina with normal color and discharge, no lesions              Cervix: normal appearance              Pap and high risk HPV testing done: no.            Bimanual Exam:  Uterus:  uterus is normal size, shape, consistency and nontender                                      Adnexa: normal adnexa in size, nontender and no masses                                      Rectovaginal: Confirms                                      Anus:  normal sphincter tone,  no lesions  ASSESSMENT   Normal gynecologic exam. Status post TAH/BSO - endometriosis.  Fibrocystic breast disease bilaterally.  Migraine headaches.   HRT patient.  Needs Prometrium for headache control.   PLAN  Mammogram recommended yearly.  Patient will need bilateral diagnostic mammogram and ultrasound at [facility].  Pap smear and high risk HPV testing not indicated.   Counseled on self breast exam, Calcium and vitamin D intake, exercise. Check Vit D level.  I told patient I would refill her HRT only very short term (10 days) until she completes her breast evaluation and it were normal.  See Epic for [place] and Prometrium.   She understands that she may need to stop this again if needs to complete further breast evaluation.  Return annually or prn   An After Visit Summary was printed and given to the patient.

## 2014-04-19 LAB — VITAMIN D 25 HYDROXY (VIT D DEFICIENCY, FRACTURES): Vit D, 25-Hydroxy: 40 ng/mL (ref 30–89)

## 2014-04-20 ENCOUNTER — Telehealth: Payer: Self-pay

## 2014-04-20 NOTE — Telephone Encounter (Signed)
Message copied by Lowella Fairy on Wed Apr 20, 2014  4:24 PM ------      Message from: [place], Shannon Foster      Created: Tue Apr 19, 2014  6:20 PM       Please report normal vitamin D level to patient.       She is in the desired range of 30 - 50.      She may take over the counter vitamin D 600 - 800 IU daily instead of her prescription dosing. ------

## 2014-04-20 NOTE — Telephone Encounter (Signed)
LMOVM [phone] to call for lab results.

## 2014-04-21 NOTE — Telephone Encounter (Signed)
Patient notified

## 2014-04-21 NOTE — Telephone Encounter (Signed)
Patient returning Amanda's call.

## 2014-04-26 ENCOUNTER — Other Ambulatory Visit: Payer: Self-pay | Admitting: Obstetrics and Gynecology

## 2014-04-26 NOTE — Telephone Encounter (Signed)
According to 04/18/14 AEX; Dr. Quincy Simmonds refilled #10 days of Prometrium and #3 Patches of Minivelle. Patient was advised to have normal mammogram result before she could get a refill on her HRT.  S/w associate at [facility] patient did have her Mammogram and US done 04/20/14.  The Results are  Mammogram: Bi-Rads 0 Bilateral Limited Ultrasound: Probably Benign recommending a 6 month f/u mammogram with bilateral limited US.  Please advise.  Reports in your Incoming Basket  (routed to Dr. Sabra Heck given Dr. Quincy Simmonds is out of the office.)

## 2014-04-27 MED ORDER — ESTRADIOL 0.1 MG/24HR TD PTTW: 1.0000 | MEDICATED_PATCH | TRANSDERMAL | Status: DC

## 2014-06-24 ENCOUNTER — Encounter: Payer: Self-pay | Admitting: Obstetrics and Gynecology

## 2014-08-05 ENCOUNTER — Ambulatory Visit: Payer: Self-pay | Admitting: Obstetrics & Gynecology

## 2015-01-10 ENCOUNTER — Telehealth: Payer: Self-pay | Admitting: *Deleted

## 2015-01-10 ENCOUNTER — Encounter: Payer: Self-pay | Admitting: Emergency Medicine

## 2015-01-10 NOTE — Telephone Encounter (Signed)
Recall Notes:  Needs 6 month follow up MMG and bilateral ultrasound  Last MMG:  04/20/14 with ultrasound.  Multiple cysts seen in both breasts.  Pt overdue for mammogram recall.  Due January 2016.  Follow up appointment has not been made.  Please call patient to schedule.  [facility].

## 2015-01-10 NOTE — Telephone Encounter (Signed)
LM for pt to call back.

## 2015-01-13 NOTE — Telephone Encounter (Signed)
LM for pt to call back to schedule MMG. Second attempt

## 2015-01-25 NOTE — Telephone Encounter (Signed)
Per Cecille Rubin at [place], pt is scheduled for 01/31/15.  Recall date extended. Routing to provider for final review.  Closing encounter.

## 2015-01-31 ENCOUNTER — Telehealth: Payer: Self-pay | Admitting: Obstetrics and Gynecology

## 2015-01-31 NOTE — Telephone Encounter (Signed)
[facility] to fax over a diagnostic mammogram order. Patient is at their office now.

## 2015-01-31 NOTE — Telephone Encounter (Signed)
Order faxed with fax confirmation received.Routing to provider for final review. Patient agreeable to disposition. Will close encounter

## 2015-04-21 ENCOUNTER — Other Ambulatory Visit: Payer: Self-pay | Admitting: Obstetrics & Gynecology

## 2015-04-21 NOTE — Telephone Encounter (Signed)
Left message for patient to call back  

## 2015-04-21 NOTE — Telephone Encounter (Signed)
[facility] to fax copy to our office. Pt had f/u done birads:1

## 2015-04-21 NOTE — Telephone Encounter (Signed)
Please see mammo copy in your door & approve or deny rx

## 2015-04-21 NOTE — Telephone Encounter (Signed)
Returning a call to Joy. °

## 2015-04-21 NOTE — Telephone Encounter (Signed)
Medication refill request: vivelle patch 0.1mg  & prometrium 100mg  Last AEX:  04-18-14 Next AEX: 04-2815 Last MMG (if hormonal medication request): 01-31-15 category d density,needs additional imaging Refill authorized: pt asking for 90 day supply lmtcb to see if patient went for breast mammo f/u

## 2015-04-26 ENCOUNTER — Ambulatory Visit: Payer: Self-pay | Admitting: Obstetrics and Gynecology

## 2015-04-27 ENCOUNTER — Ambulatory Visit: Payer: Self-pay | Admitting: Obstetrics and Gynecology

## 2015-05-22 ENCOUNTER — Other Ambulatory Visit: Payer: Self-pay | Admitting: Obstetrics & Gynecology

## 2015-05-22 NOTE — Telephone Encounter (Signed)
04/21/15 #24/0 rfs [facility] [address] pt should have refills until August; scheduled for AEX 05/25/15 with Dr. Quincy Simmonds.

## 2015-05-25 ENCOUNTER — Encounter: Payer: Self-pay | Admitting: Obstetrics and Gynecology

## 2015-05-25 ENCOUNTER — Ambulatory Visit (INDEPENDENT_AMBULATORY_CARE_PROVIDER_SITE_OTHER): Payer: 59 | Admitting: Obstetrics and Gynecology

## 2015-05-25 VITALS — BP 90/60 | HR 84 | Resp 16 | Ht 64.25 in | Wt 99.0 lb

## 2015-05-25 DIAGNOSIS — Z01419 Encounter for gynecological examination (general) (routine) without abnormal findings: Secondary | ICD-10-CM | POA: Diagnosis not present

## 2015-05-25 DIAGNOSIS — Z0000 Encounter for general adult medical examination without abnormal findings: Secondary | ICD-10-CM | POA: Diagnosis not present

## 2015-05-25 DIAGNOSIS — N6002 Solitary cyst of left breast: Secondary | ICD-10-CM | POA: Diagnosis not present

## 2015-05-25 DIAGNOSIS — N6001 Solitary cyst of right breast: Secondary | ICD-10-CM | POA: Diagnosis not present

## 2015-05-25 LAB — POCT URINALYSIS DIPSTICK
BILIRUBIN UA: NEGATIVE
Blood, UA: NEGATIVE
Glucose, UA: NEGATIVE
KETONES UA: NEGATIVE
Leukocytes, UA: NEGATIVE
NITRITE UA: NEGATIVE
Protein, UA: NEGATIVE
UROBILINOGEN UA: NEGATIVE
pH, UA: 6

## 2015-05-25 MED ORDER — ESTRADIOL 0.1 MG/24HR TD PTTW: MEDICATED_PATCH | TRANSDERMAL | Status: DC

## 2015-05-25 MED ORDER — PROGESTERONE MICRONIZED 100 MG PO CAPS: 100.0000 mg | ORAL_CAPSULE | Freq: Every day | ORAL | Status: DC

## 2015-05-25 NOTE — Patient Instructions (Signed)

## 2015-05-25 NOTE — Progress Notes (Signed)
54 y.o. G0P0 Married Caucasian female here for annual exam.   On Vivelle Dot 0.1 mg and Prometrium 100 mg daily for headache prevention.  Wants to continue.   History of macrocysts of the breasts life long. Had diagnostic mammogram and breast ultrasound on 01/31/15.  Benign cysts.  Husband had heart transplant 20 years ago and having problems with his cardiac health. Patient indicating there is nothing further to do to improve his care.  On O2 at home.  Patient has lost some weight and is trying to gain it back.   PCP:   Baruch Goldmann - [place] [place].  No LMP recorded. Patient has had a hysterectomy.          Sexually active: Yes.    The current method of family planning is status post hysterectomy.   Status post TAH/BSO. Exercising: Yes.    Walking Smoker:  no  Health Maintenance: Pap:  2001 Normal  History of abnormal Pap:  no MMG:01/31/15 BIRADS0:Incomplete. [place] Bilateral BIRADS1:neg  Colonoscopy:  2015 Dr. Collene Mares every 10 years  BMD:  2007  Result: Osteopenia  TDaP:  2013 Screening Labs:  Hb today: , Urine today: Negative    reports that she has never smoked. She has never used smokeless tobacco. She reports that she does not drink alcohol or use illicit drugs.  Past Medical History  Diagnosis Date  . Migraine   . Raynaud's disease   . Fibrocystic breast disease     Past Surgical History  Procedure Laterality Date  . Hysteroscopy  2001    BSO- endometriosis  . Oophorectomy Bilateral     with TAH  . Pelvic laparoscopy  3x     endometriosis, LOA  . Abdominal hysterectomy      Current Outpatient Prescriptions  Medication Sig Dispense Refill  . doxycycline (VIBRAMYCIN) 100 MG capsule TK 1 CAPSULE PO ONCE DAILY  1  . estradiol (VIVELLE-DOT) 0.1 MG/24HR patch PLACE 1 PATCH TOPICALLY ON THE SKIN TWICE WEEKLY 24 patch 0  . FINACEA 15 % cream APPLY GEL ONCE OR TWICE DAILY TO FACE.  2  . magnesium 30 MG tablet Take 30 mg by mouth daily.    . Omega-3 Fatty Acids (FISH OIL) 1000 MG  CAPS Take by mouth daily.    . progesterone (PROMETRIUM) 100 MG capsule TAKE ONE CAPSULE BY MOUTH EVERY NIGHT AT BEDTIME 90 capsule 0  . rizatriptan (MAXALT) 10 MG tablet     . Vitamin D, Ergocalciferol, (DRISDOL) 50000 UNITS CAPS TAKE 1 CAPSULE BY MOUTH EVERY OTHER WEEK 26 capsule 0   No current facility-administered medications for this visit.    Family History  Problem Relation Age of Onset  . Parkinson's disease Father   . Hypertension Mother   . Cancer Brother     prostate    ROS:  Pertinent items are noted in HPI.  Otherwise, a comprehensive ROS was negative.  Exam:   BP 90/60 mmHg  Pulse 84  Resp 16  Ht 5' 4.25" (1.632 m)  Wt 99 lb (44.906 kg)  BMI 16.86 kg/m2    General appearance: alert, cooperative and appears stated age Head: Normocephalic, without obvious abnormality, atraumatic Neck: no adenopathy, supple, symmetrical, trachea midline and thyroid normal to inspection and palpation Lungs: clear to auscultation bilaterally Breasts:  Multiple masses throughout bilateral breasts of multiple sizes. Nontender.  No true retractions. No axillary adenopathy. Heart: regular rate and rhythm Abdomen: soft, non-tender; bowel sounds normal; no masses,  no organomegaly Extremities: extremities normal, atraumatic, no  cyanosis or edema Skin: Skin color, texture, turgor normal. No rashes or lesions Lymph nodes: Cervical, supraclavicular, and axillary nodes normal. No abnormal inguinal nodes palpated Neurologic: Grossly normal  Pelvic: External genitalia:  no lesions              Urethra:  normal appearing urethra with no masses, tenderness or lesions              Bartholins and Skenes: normal                 Vagina: normal appearing vagina with normal color and discharge, no lesions              Cervix: absent              Pap taken: No. Bimanual Exam:  Uterus:  uterus absent              Adnexa: no mass, fullness, tenderness              Rectovaginal: Yes.  .  Confirms.               Anus:  normal sphincter tone, no lesions  Chaperone was present for exam.  Assessment:   Well woman visit with normal exam. Status post TAH/BSO - endometriosis.  Fibrocystic breast disease bilaterally.  I old patient it is very difficult for me to determine if her masses are benign or malignant just based on breast exam. Migraine headaches.  HRT patient. Needs Prometrium for headache control.    Osteopenia.   Plan: Yearly mammogram recommended after age 2.  I have discussed with patient doing a breast MRI.  Recommended self breast exam.  Pap and HR HPV as above. Discussed Calcium, Vitamin D, regular exercise program including cardiovascular and weight bearing exercise. Labs performed.  Yes.  .   See orders. Refills given on medications.  Yes.  .  See orders.  Patient and I had a long discussion of HRT risks and benefits. I informed patient that risks of HRT are DVT, PE, MI, stroke, and breast cancer.  We discussed that discontinuation of the HRT will likely improve her breast health and ability to perform a better breast evaluation for her.  Bone density ordered for [facility].  Follow up annually and prn.      After visit summary provided.

## 2015-05-26 LAB — TSH: TSH: 2.062 u[IU]/mL (ref 0.350–4.500)

## 2015-05-26 LAB — LIPID PANEL
CHOL/HDL RATIO: 2.4 ratio (ref <=5.0)
CHOLESTEROL: 191 mg/dL (ref 125–200)
HDL: 81 mg/dL (ref >=46)
LDL Cholesterol: 89 mg/dL (ref <130)
TRIGLYCERIDES: 107 mg/dL (ref <150)
VLDL: 21 mg/dL (ref <30)

## 2015-05-26 LAB — COMPREHENSIVE METABOLIC PANEL
ALK PHOS: 57 U/L (ref 33–130)
ALT: 21 U/L (ref 6–29)
AST: 26 U/L (ref 10–35)
Albumin: 4.3 g/dL (ref 3.6–5.1)
BUN: 15 mg/dL (ref 7–25)
CHLORIDE: 105 meq/L (ref 98–110)
CO2: 27 meq/L (ref 20–31)
CREATININE: 0.99 mg/dL (ref 0.50–1.05)
Calcium: 9.1 mg/dL (ref 8.6–10.4)
Glucose, Bld: 61 mg/dL — ABNORMAL LOW (ref 65–99)
POTASSIUM: 4 meq/L (ref 3.5–5.3)
SODIUM: 140 meq/L (ref 135–146)
Total Bilirubin: 0.3 mg/dL (ref 0.2–1.2)
Total Protein: 6.7 g/dL (ref 6.1–8.1)

## 2015-05-26 LAB — CBC
HCT: 44.1 % (ref 36.0–46.0)
HEMOGLOBIN: 15.2 g/dL — AB (ref 12.0–15.0)
MCH: 31.3 pg (ref 26.0–34.0)
MCHC: 34.5 g/dL (ref 30.0–36.0)
MCV: 90.7 fL (ref 78.0–100.0)
MPV: 9.8 fL (ref 8.6–12.4)
Platelets: 281 10*3/uL (ref 150–400)
RBC: 4.86 MIL/uL (ref 3.87–5.11)
RDW: 13.2 % (ref 11.5–15.5)
WBC: 5.2 10*3/uL (ref 4.0–10.5)

## 2015-05-29 ENCOUNTER — Telehealth: Payer: Self-pay | Admitting: Emergency Medicine

## 2015-05-29 DIAGNOSIS — N6002 Solitary cyst of left breast: Principal | ICD-10-CM

## 2015-05-29 DIAGNOSIS — N6001 Solitary cyst of right breast: Secondary | ICD-10-CM

## 2015-05-29 NOTE — Telephone Encounter (Signed)
Dr. Quincy Simmonds has ordered bilateral breast MRI with contrast for patient.  Last mammogram completed at [facility]: 3D Diagnostic Bilateral  and Bilateral Breast Ultrasound 01/31/15 Patient has Bilateral complicated cyst pattern. L Breast pattern is Left lower outer quadrant. R is 3:00.  Category D Breast Density.  Multiple breast cyst aspirations completed at [facility], hard copy chart 11/10/11 by Dr. Rebekah Chesterfield.    Called [facility] left message for Alyse Low to return my call to obtain instructions for scheduling patient for Breast MRI.

## 2015-05-30 ENCOUNTER — Telehealth: Payer: Self-pay

## 2015-05-30 NOTE — Telephone Encounter (Signed)
Spoke with patient. Advised of results as seen below from Dr.Silva. Patient is agreeable and verbalizes understanding.  Routing to provider for final review. Patient agreeable to disposition. Will close encounter.   Patient aware provider will review message and nurse will return call if any additional advice or change of disposition.    

## 2015-05-30 NOTE — Telephone Encounter (Signed)
-----   Message from Nunzio Cobbs, MD sent at 05/26/2015  6:30 AM EDT ----- Please inform patient of results. Glucose just a little low and hemoglobin just a touch high (hematocrit is normal.) These minor alterations are not of concern.  All other labs normal - lipids, blood chemistries, thyroid.  My recommendation is for repeat labs in one year.  Cc- Shannon Foster

## 2015-06-05 NOTE — Telephone Encounter (Signed)
Call to [facility]. She states that Mammogram from [facility] 01/31/15 is current enough to have breast MRI. She will request records and contact patient to schedule.

## 2015-06-05 NOTE — Telephone Encounter (Signed)
Shannon Foster is returning a call to Shannon Foster.

## 2015-06-06 NOTE — Telephone Encounter (Signed)
Message left to return call to [place] at [phone].   Call to patient to update. To advise that she should hear from [place] to schedule Breast MRI.

## 2015-06-08 NOTE — Telephone Encounter (Signed)
[facility] left patient a message on her mobile phone 06/06/15 to schedule.

## 2015-06-13 LAB — HEMOGLOBIN A1C: HEMOGLOBIN A1C: 5.1

## 2015-06-19 NOTE — Telephone Encounter (Signed)
Call to patient  at [phone]. Epic has this number listed as home number. On designated party release form patient has signed this number as mobile.  Husband answered line and message left to return call to [facility] at Dr. Elza Rafter office and office number given for return call. No further information given.  Last annual 01/31/15 at [facility].  Patient needs to call [facility] directly to schedule Breast MRI.

## 2015-06-20 NOTE — Telephone Encounter (Signed)
Spoke with Faith Rogue case reviewer for precert of breast MRI. Copy of most recent mammogram and ultrasound need to be sent for review to [id number] reference number [id number]. Results faxed with cover sheet and confirmation to number provided.  Cc: Theresia Lo

## 2015-06-20 NOTE — Telephone Encounter (Signed)
Patient returned call. Requested call back at [phone]

## 2015-06-20 NOTE — Telephone Encounter (Signed)
Thank you for the update!

## 2015-06-20 NOTE — Telephone Encounter (Signed)
Message left to return call to Nori Winegar at [phone].    

## 2015-06-23 NOTE — Telephone Encounter (Signed)
Called patient.  She states she is holding off on scheduling MRI. She has very high deductible plan and will be paying entire cost for MRI. Patient states "I didn't want it to have to come to these extremes but if Dr. Quincy Simmonds insists, then I will do it." Advised that Dr. Quincy Simmonds has made her recommendation of breast MRI based on her prior history and breast cysts.   Advised patient to call [facility] to obtain cost information and let us know her decision. Number for [facility] given.  Update to Dr. Quincy Simmonds.

## 2015-06-25 NOTE — Telephone Encounter (Signed)
OK, I understand the patient's concern about the MRI.  I think the goal is to be certain the patient has good breast health and is ok to continue on her HRT. Other options are: 1.  Stop HRT, re-examine her in 2 months, and consider other options such as Brisdelle or Effexor for treatment of hot flashes. 2.  Continue HRT and see a general surgeon again for an opinion.  Please let me know how I can help.  Thanks.

## 2015-06-27 NOTE — Telephone Encounter (Signed)
Message left to return call to Gilberto Streck at [phone].    

## 2015-06-29 ENCOUNTER — Encounter: Payer: Self-pay | Admitting: Emergency Medicine

## 2015-06-29 NOTE — Telephone Encounter (Signed)
Patient returned call. She would like to continue with plan at this time for MRI and will discuss cost with [facility] if approved by [employer].  Advised will send records for pre-cert to [employer].   Records faxed at this time with request for appeal signed by Dr. Quincy Simmonds. Will wait for response.

## 2015-07-05 NOTE — Telephone Encounter (Signed)
Called [employer] on behalf of [employer] and unable to reach Chartered certified accountant.

## 2015-07-06 NOTE — Telephone Encounter (Signed)
[place] for status of Appeal.  They advised they do not have updated records with appeal and letter from Dr. Quincy Simmonds.  Faxed again to [phone]. Fax confirmation received.

## 2015-07-11 NOTE — Telephone Encounter (Signed)
Called [employer] to check status of reconsideration.  They advised that the reconsideration has been sent to [employer] and I must contacted [employer] to discuss.  [place] at [phone], spoke with representative, Tamika. She states that Appeal has been received and Case ID is [id number] They have until 07/29/15 to make a decision.

## 2015-07-18 ENCOUNTER — Telehealth: Payer: Self-pay

## 2015-07-18 NOTE — Telephone Encounter (Signed)
Called patient with update, Advised that [place] healthcare will take up to one month for response. Advised patient to return call or we will call with response from insurance company. Patient agreeable.

## 2015-07-18 NOTE — Telephone Encounter (Signed)
Left message to call [place] at [phone].  Need to advise patient of BMD results from [place]. Paper results on my desk.

## 2015-07-19 NOTE — Telephone Encounter (Signed)
Patient returning call.

## 2015-07-19 NOTE — Telephone Encounter (Signed)
Left message to call Kaitlyn at [phone]. 

## 2015-07-20 NOTE — Telephone Encounter (Signed)
Spoke with patient. Advised of bone density results as seen on paper copy from [place]. Advised patient that she has had bone lose in her right hip and spine since last bone density. Hips and spine are now in the osteopenia range. She will need to continue with weight bearing exercises, calcium, and vitamin D and retest in 2 years. Patient is agreeable and verbalizes understanding.  Routing to provider for final review. Patient agreeable to disposition. Will close encounter.

## 2015-08-15 NOTE — Telephone Encounter (Signed)
Message left to return call to [facility] at [phone] to discuss MRI scheduling.

## 2015-08-15 NOTE — Telephone Encounter (Signed)
Called and spoke with [place] at [facility].  Advised need to schedule Breast MRI, however, last Mammogram done 01/31/15. Appeals process for prior authorization since 05/2015.  Advised patient has concerns regarding cost of MRI. Alyse Low will have Scientist, clinical (histocompatibility and immunogenetics) at [address] review and will return my call.

## 2015-08-15 NOTE — Telephone Encounter (Signed)
Called to [place]. Advised that authorization denial was overturned and approved on 07/27/15 through 10/13/15.  They will fax confirmation at this time.

## 2015-08-22 NOTE — Telephone Encounter (Signed)
Message left to return call to Michelyn Scullin at [phone].    

## 2015-09-07 NOTE — Telephone Encounter (Signed)
Patient returning Tracy's call best # to reach [phone].

## 2015-09-11 NOTE — Telephone Encounter (Signed)
Returned call to patient. Advised received authorization from [employer] for bilateral breast MRI and authorization is until 10/13/15.  Advised will need diagnostic mammogram prior to MRI since current mammogram is more than 6 months old.  Advised of cost of MRI per [facility], 1,325.49.    Discussed with patient message as below from Dr. Quincy Simmonds again regarding choices that are available for her if she does not want to plan for MRI.  Patient states she is going to think about things and return call with response tomorrow.   Okay to close?

## 2015-09-11 NOTE — Telephone Encounter (Signed)
I recommend patient have at least her yearly mammogram.  3D mammogram may be helpful.  She is due in April 2017.  OK to hold off on MRI at this time.

## 2015-09-25 ENCOUNTER — Telehealth: Payer: Self-pay | Admitting: Obstetrics and Gynecology

## 2015-09-25 NOTE — Telephone Encounter (Signed)
Returned call to patient. She states she wishes to wait until at least January to have MRI of breast. Advised of message from Dr. Quincy Simmonds. Patient agreeable to plan. She will call us in April after scheduling 3D mammogram so that precert for MRI can be started if necessary.  Routing to provider for final review. Patient agreeable to disposition. Will close encounter.

## 2015-09-25 NOTE — Telephone Encounter (Signed)
Patient has a question for Olivia Mackie regarding an MRI. Last seen 05/25/15.

## 2015-09-25 NOTE — Telephone Encounter (Signed)
Nunzio Cobbs, MD at 09/11/2015 5:07 PM     Status: Signed       Expand All Collapse All   I recommend patient have at least her yearly mammogram. 3D mammogram may be helpful.  She is due in April 2017.  OK to hold off on MRI at this time.

## 2015-09-25 NOTE — Telephone Encounter (Signed)
Patient called 09/25/15. Message from Dr. Quincy Simmonds placed on new message. Will close this encounter.

## 2016-03-12 ENCOUNTER — Telehealth: Payer: Self-pay | Admitting: *Deleted

## 2016-03-12 NOTE — Telephone Encounter (Signed)
Routing to Dr.Silva as FYI. Will close encounter.

## 2016-03-12 NOTE — Telephone Encounter (Signed)
Patient wants Dr. Quincy Simmonds to know that she is going to get her mammogram done at [facility].  Routed to Triage [facility]

## 2016-03-21 DIAGNOSIS — L820 Inflamed seborrheic keratosis: Secondary | ICD-10-CM | POA: Diagnosis not present

## 2016-03-21 DIAGNOSIS — L814 Other melanin hyperpigmentation: Secondary | ICD-10-CM | POA: Diagnosis not present

## 2016-03-21 DIAGNOSIS — L309 Dermatitis, unspecified: Secondary | ICD-10-CM | POA: Diagnosis not present

## 2016-04-08 ENCOUNTER — Telehealth: Payer: Self-pay | Admitting: Obstetrics and Gynecology

## 2016-04-08 NOTE — Telephone Encounter (Signed)
Patient scheduled [facility] with [facility] and reported that she is having breast issue in left breast and request we order left breast diagnostic MMG. Advised patient she will need to come to office for evaluation first. Patient frustrated and asked if she would have had to do this if she had just "not told them she had a breast problem" Advised that would not be recommend as she would want to take all breast issues seriously. Office visit scheduled for 04-10-16 at 230 with dr Quincy Simmonds.  Routing to provider for final review. Patient agreeable to disposition. Will close encounter.

## 2016-04-08 NOTE — Telephone Encounter (Signed)
Patient is asking for an order for a diagnostic MMG to be sent to the breast center of [place]. Patient needs this order sent asap in order to hold the appointment slot for 04/12/16.

## 2016-04-10 ENCOUNTER — Ambulatory Visit: Payer: 59 | Admitting: Obstetrics and Gynecology

## 2016-04-12 ENCOUNTER — Ambulatory Visit: Payer: 59 | Admitting: Obstetrics and Gynecology

## 2016-04-12 ENCOUNTER — Telehealth: Payer: Self-pay | Admitting: Obstetrics and Gynecology

## 2016-04-12 NOTE — Telephone Encounter (Signed)
Thank you for the update.  I have closed the encounter.  

## 2016-04-12 NOTE — Telephone Encounter (Signed)
Patient called and said, "I need to cancel my appointment for today with Dr. Quincy Simmonds to check my breast. The cyst has dissipated on it's own. I have lumpy breasts and that just happens sometimes. I will just schedule my regular mammogram appointment now and have that report sent to the doctor."   Routing to Dr. Quincy Simmonds for [facility] and follow up, if needed.

## 2016-04-15 DIAGNOSIS — N6002 Solitary cyst of left breast: Secondary | ICD-10-CM | POA: Diagnosis not present

## 2016-04-15 DIAGNOSIS — N6001 Solitary cyst of right breast: Secondary | ICD-10-CM | POA: Diagnosis not present

## 2016-04-22 ENCOUNTER — Other Ambulatory Visit: Payer: Self-pay | Admitting: Obstetrics and Gynecology

## 2016-04-22 NOTE — Telephone Encounter (Signed)
Medication refill request: Vivelle Patch  Last AEX:  05-25-15  Next AEX: 06-07-16 Last MMG (if hormonal medication request): 04-15-16  Refill authorized: please advise

## 2016-04-23 ENCOUNTER — Encounter: Payer: Self-pay | Admitting: Obstetrics and Gynecology

## 2016-04-23 ENCOUNTER — Other Ambulatory Visit: Payer: Self-pay | Admitting: *Deleted

## 2016-04-23 MED ORDER — ESTRADIOL 0.1 MG/24HR TD PTTW: MEDICATED_PATCH | TRANSDERMAL | Status: DC

## 2016-04-23 MED ORDER — PROGESTERONE MICRONIZED 100 MG PO CAPS: 100.0000 mg | ORAL_CAPSULE | Freq: Every day | ORAL | Status: DC

## 2016-04-23 NOTE — Telephone Encounter (Signed)
RF's completed for Vivelle dot and Prometrium.  Please let her know.

## 2016-04-23 NOTE — Telephone Encounter (Signed)
Patient called about refill request. She is leaving to go out of town tomorrow. She is requesting a refill for today.   Sending to Dr. Sabra Heck since Dr. Quincy Simmonds is out of the office today.

## 2016-04-23 NOTE — Telephone Encounter (Signed)
Thank you for doing the refill.  I did not recall the result of her diagnostic breast imaging last night when I was reviewing the medication request.  It was not scanned in at that time.

## 2016-06-07 ENCOUNTER — Ambulatory Visit: Payer: 59 | Admitting: Obstetrics and Gynecology

## 2016-08-14 ENCOUNTER — Ambulatory Visit: Payer: 59 | Admitting: Obstetrics and Gynecology

## 2016-11-28 DIAGNOSIS — L853 Xerosis cutis: Secondary | ICD-10-CM | POA: Diagnosis not present

## 2016-11-28 DIAGNOSIS — L719 Rosacea, unspecified: Secondary | ICD-10-CM | POA: Diagnosis not present

## 2016-11-28 DIAGNOSIS — L820 Inflamed seborrheic keratosis: Secondary | ICD-10-CM | POA: Diagnosis not present

## 2016-12-10 DIAGNOSIS — M2570 Osteophyte, unspecified joint: Secondary | ICD-10-CM | POA: Diagnosis not present

## 2016-12-10 DIAGNOSIS — B351 Tinea unguium: Secondary | ICD-10-CM | POA: Diagnosis not present

## 2017-02-24 DIAGNOSIS — L570 Actinic keratosis: Secondary | ICD-10-CM | POA: Diagnosis not present

## 2017-04-01 ENCOUNTER — Telehealth: Payer: Self-pay | Admitting: Physician Assistant

## 2017-04-01 NOTE — Telephone Encounter (Signed)
Called patient to be scheduled as a new patient per her submission online to [employer]. Left a voicemail for patient to call back to make an appointment.

## 2017-05-22 ENCOUNTER — Ambulatory Visit (INDEPENDENT_AMBULATORY_CARE_PROVIDER_SITE_OTHER): Payer: BLUE CROSS/BLUE SHIELD

## 2017-05-22 ENCOUNTER — Encounter: Payer: Self-pay | Admitting: Physician Assistant

## 2017-05-22 ENCOUNTER — Ambulatory Visit (INDEPENDENT_AMBULATORY_CARE_PROVIDER_SITE_OTHER): Payer: BLUE CROSS/BLUE SHIELD | Admitting: Physician Assistant

## 2017-05-22 VITALS — BP 100/70 | HR 91 | Temp 97.9°F | Ht 64.5 in | Wt 101.5 lb

## 2017-05-22 DIAGNOSIS — M8580 Other specified disorders of bone density and structure, unspecified site: Secondary | ICD-10-CM

## 2017-05-22 DIAGNOSIS — M25532 Pain in left wrist: Secondary | ICD-10-CM | POA: Diagnosis not present

## 2017-05-22 DIAGNOSIS — H04123 Dry eye syndrome of bilateral lacrimal glands: Secondary | ICD-10-CM | POA: Diagnosis not present

## 2017-05-22 DIAGNOSIS — E559 Vitamin D deficiency, unspecified: Secondary | ICD-10-CM | POA: Insufficient documentation

## 2017-05-22 MED ORDER — NEOMYCIN-POLYMYXIN-DEXAMETH 3.5-10000-0.1 OP SUSP: 1.0000 [drp] | Freq: Four times a day (QID) | OPHTHALMIC | 1 refills | Status: DC

## 2017-05-22 NOTE — Patient Instructions (Signed)
It was great to meet you!  We will call you with your xray results and determine treatment at that time.  Please schedule a physical with [place]!  We should repeat your DEXA bone scan and do some routine labs, whenever you are able.   Wrist Pain, Adult There are many things that can cause wrist pain. Some common causes include:  An injury to the wrist area.  Overuse of the joint.  A condition that causes too much pressure to be put on a nerve in the wrist (carpal tunnel syndrome).  Wear and tear of the joints that happens as a person gets older (osteoarthritis).  Other types of arthritis.  Sometimes, the cause of wrist pain is not known. Often, the pain goes away when you follow your doctor's instructions for helping pain at home, such as resting or icing your wrist. If your wrist pain does not go away, it is important to tell your doctor. Follow these instructions at home:  Rest the wrist area for 48 hours or more, or as long as told by your doctor.  If a splint or elastic bandage has been put on your wrist, use it as told by your doctor. ? Take off the splint or bandage only as told by your doctor. ? Loosen the splint or bandage if your fingers tingle, lose feeling (get numb), or turn cold or blue.  If directed, apply ice to the injured area: ? If you have a removable splint or elastic bandage, remove it as told by your doctor. ? Put ice in a plastic bag. ? Place a towel between your skin and the bag or between your splint or bandage and the bag. ? Leave the ice on for 20 minutes, 2-3 times a day.  Keep your arm raised (elevated) above the level of your heart while you are sitting or lying down.  Take over-the-counter and prescription medicines only as told by your doctor.  Keep all follow-up visits as told by your doctor. This is important. Contact a doctor if:  You have a sudden sharp pain in the wrist, hand, or arm that is different or new.  The swelling or bruising on  your wrist or hand gets worse.  Your skin becomes red, gets a rash, or has open sores.  Your pain does not get better or it gets worse. Get help right away if:  You lose feeling in your fingers or hand.  Your fingers turn white, very red, or cold and blue.  You cannot move your fingers.  You have a fever or chills. This information is not intended to replace advice given to you by your health care provider. Make sure you discuss any questions you have with your health care provider. Document Released: 04/01/2008 Document Revised: 05/09/2016 Document Reviewed: 05/02/2016 Elsevier Interactive Patient Education  2017 [employer].

## 2017-05-22 NOTE — Progress Notes (Signed)
Shannon Foster is a 56 y.o. female here to Establish Care and pain left wrist off and on.  I acted as a Education administrator for [employer], PA-C Shannon Pickler, LPN  History of Present Illness:   Chief Complaint  Patient presents with  . Establish Care    BC/BS  . Left wrist pain    edema and red, started on Saturday    Acute Concerns: L wrist pain -- patient reports that over the weekend she was spreading pine needles and doing yard work. She had swelling and pain to her L wrist afterwards. Took some ASA and pain/swelling resolved. Yesterday the swelling and pain returned, however less pain and swelling than initially presented. Denies insect bites, warmth, fevers, or streaking. She is not on regularly scheduled blood thinners.  Chronic Issues: Osteopenia -- was told that she had some bone loss; per DEXA on 07/10/15 she had osteopenia in hips and spine was told to start vit D/calcium supplement and is currently on vit D but no calcium, needs repeat DEXA in Sep 2018 Chronically dry eyes -- has bilateral eye dryness that she treats with neomycin-polymyxin b-dexamethasone drops, needs refill today, this medication works well for her  Health Maintenance: Weight -- Weight: 101 lb 8 oz (46 kg)   Depression screen PHQ 2/9 05/22/2017  Decreased Interest 0  Down, Depressed, Hopeless 0  PHQ - 2 Score 0    No flowsheet data found.  Other providers/specialists: Sees Ob-Gyn  Past Medical History:  Diagnosis Date  . Fibrocystic breast disease   . History of shingles 2014  . Migraine   . Raynaud's disease      Social History   Social History  . Marital status: Divorced    Spouse name: N/A  . Number of children: N/A  . Years of education: N/A   Occupational History  . Not on file.   Social History Main Topics  . Smoking status: Never Smoker  . Smokeless tobacco: Never Used  . Alcohol use No  . Drug use: No  . Sexual activity: Yes    Partners: Male    Birth control/  protection: Surgical     Comment: TAH/BSO   Other Topics Concern  . Not on file   Social History Narrative  . No narrative on file    Past Surgical History:  Procedure Laterality Date  . ABDOMINAL HYSTERECTOMY    . HYSTEROSCOPY  2001   BSO- endometriosis  . OOPHORECTOMY Bilateral    with TAH  . PELVIC LAPAROSCOPY  3x    endometriosis, LOA    Family History  Problem Relation Age of Onset  . Parkinson's disease Father   . Hypertension Mother   . Cancer Brother        prostate  . Suicidality Sister     Allergies  Allergen Reactions  . Codeine Nausea And Vomiting     Current Medications:   Current Outpatient Prescriptions:  .  cholecalciferol (VITAMIN D) 1000 units tablet, Take 1,000 Units by mouth daily., Disp: , Rfl:  .  rizatriptan (MAXALT) 10 MG tablet, Take 10 mg by mouth as needed for migraine. May repeat in 2 hours if needed, Disp: , Rfl:  .  FINACEA 15 % cream, APPLY GEL ONCE OR TWICE DAILY TO FACE., Disp: , Rfl: 2 .  magnesium 30 MG tablet, Take 30 mg by mouth daily., Disp: , Rfl:  .  neomycin-polymyxin b-dexamethasone (MAXITROL) 3.5-10000-0.1 SUSP, Place 1 drop into both eyes every 6 (six) hours.,  Disp: 5 mL, Rfl: 1   Review of Systems:   Review of Systems  Constitutional: Negative for chills, fever, malaise/fatigue and weight loss.  HENT:       Dry eyes   Musculoskeletal: Positive for joint pain (L wrist).    Vitals:   Vitals:   05/22/17 1048  BP: 100/70  Pulse: 91  Temp: 97.9 F (36.6 C)  TempSrc: Oral  SpO2: 99%  Weight: 101 lb 8 oz (46 kg)  Height: 5' 4.5" (1.638 m)     Body mass index is 17.15 kg/m.  Physical Exam:   Physical Exam  Constitutional: She appears well-developed. She is cooperative.  Non-toxic appearance. She does not have a sickly appearance. She does not appear ill. No distress.  Cardiovascular: Normal rate, regular rhythm, S1 normal, S2 normal, normal heart sounds, intact distal pulses and normal pulses.   No LE  edema, capillary refill in L digits normal  Pulmonary/Chest: Effort normal and breath sounds normal.  Musculoskeletal:  Erythema, ecchymosis and tenderness to L radial side of wrist, positive Finkelstein's, decreased ROM with extension of wrist. No palpable deformities. Very thin bony wrist and body size.  Neurological: She is alert. No sensory deficit. GCS eye subscore is 4. GCS verbal subscore is 5. GCS motor subscore is 6.  Normal sensation to L hand  Skin: Skin is warm, dry and intact.  Psychiatric: She has a normal mood and affect. Her speech is normal and behavior is normal.  Nursing note and vitals reviewed.  CLINICAL DATA:  Wrist pain, no injury  EXAM: LEFT WRIST - COMPLETE 3+ VIEW  COMPARISON:  07/13/2012  FINDINGS: There is no evidence of fracture or dislocation. There is no evidence of arthropathy or other focal bone abnormality. Soft tissues are unremarkable.  IMPRESSION: Negative.   Electronically Signed   By: Franchot Gallo M.D.   On: 05/22/2017 13:25  Assessment and Plan:    [name] was seen today for establish care and left wrist pain.  Diagnoses and all orders for this visit:  Left wrist pain Xray is without acute fracture per radiology, however I personally reviewed this xray with Dr. Teresa Foster and given patient's hx of osteopenia and concerning history of present illness, will opt for conservative management with wrist brace. Follow-up with Dr. Paulla Foster in 1-2 weeks if symptoms persist. May take tylenol or ibuprofen for the wrist pain. -     DG Wrist Complete Left; Future  Osteopenia, unspecified location Repeat DEXA due Sep 2018. Continue vit D supplement, add in calcium supplement.   Chronic dryness of both eyes Well controlled, refill drops to use prn.  Other orders -     neomycin-polymyxin b-dexamethasone (MAXITROL) 3.5-10000-0.1 SUSP; Place 1 drop into both eyes every 6 (six) hours.    . Reviewed expectations re: course of current  medical issues. . Discussed self-management of symptoms. . Outlined signs and symptoms indicating need for more acute intervention. . Patient verbalized understanding and all questions were answered. . See orders for this visit as documented in the electronic medical record. . Patient received an After-Visit Summary.  CMA or LPN served as scribe during this visit. History, Physical, and Plan performed by medical provider. Documentation and orders reviewed and attested to.  Inda Coke, PA-C

## 2017-05-26 ENCOUNTER — Telehealth: Payer: Self-pay | Admitting: *Deleted

## 2017-05-26 NOTE — Telephone Encounter (Signed)
Patient in 04 recall for 03/2017. Please needs follow up breast imaging. Please contact patient regarding scheduling

## 2017-05-26 NOTE — Telephone Encounter (Signed)
Tried calling patient, no answer, left message to call me back. 

## 2017-05-28 ENCOUNTER — Telehealth: Payer: Self-pay | Admitting: Physician Assistant

## 2017-05-28 NOTE — Telephone Encounter (Signed)
ROI faxed to [facility]

## 2017-05-28 NOTE — Telephone Encounter (Signed)
ROI faxed to [place] @ [facility]

## 2017-05-29 NOTE — Telephone Encounter (Signed)
2nd attempt to reach patient. No answer, left message to call back.

## 2017-06-06 NOTE — Telephone Encounter (Signed)
Rec'd from [place] @ [facility] forwarded 12 pages to [employer] PA

## 2017-06-12 ENCOUNTER — Encounter: Payer: Self-pay | Admitting: *Deleted

## 2017-06-12 NOTE — Telephone Encounter (Signed)
Patient has not returned call regarding 04 recall. Please advise on recall status/ letter -eh

## 2017-06-12 NOTE — Telephone Encounter (Signed)
Ok to send recall letter for breast imaging.   She is also overdue for her annual exam.   You could put both items on the same letter.  Thank you.

## 2017-06-12 NOTE — Telephone Encounter (Signed)
No return call back from patient.

## 2017-06-12 NOTE — Telephone Encounter (Signed)
Letter sent and removed from recall -eh

## 2017-06-19 ENCOUNTER — Encounter: Payer: Self-pay | Admitting: *Deleted

## 2017-06-19 ENCOUNTER — Encounter: Payer: Self-pay | Admitting: Physician Assistant

## 2017-07-09 ENCOUNTER — Telehealth: Payer: Self-pay | Admitting: Physician Assistant

## 2017-07-09 ENCOUNTER — Other Ambulatory Visit: Payer: Self-pay | Admitting: Family Medicine

## 2017-07-09 MED ORDER — RIZATRIPTAN BENZOATE 10 MG PO TABS: ORAL_TABLET | ORAL | 2 refills | Status: DC

## 2017-07-09 NOTE — Telephone Encounter (Signed)
Patient's husband called in reference to notes below. Informed him that the Rx had been sent.

## 2017-07-09 NOTE — Telephone Encounter (Signed)
Okay to refill 10 mg Maxalt, 10#  with 2 refills.  Thanks, Inda Coke, PA-C

## 2017-07-09 NOTE — Telephone Encounter (Signed)
Spoke to pt told her Rx refill for Maxalt was sent to pharmacy. Pt verbalized understanding.

## 2017-07-09 NOTE — Telephone Encounter (Signed)
MEDICATION: rizatriptan (MAXALT) 10 MG tablet  PHARMACY:   [id number] - [place],  - [address] [phone] (Phone) [phone] (Fax)     IS THIS A 90 DAY SUPPLY :  N  IS PATIENT OUT OF MEDICATION: Y  IF NOT; HOW MUCH IS LEFT:   LAST APPOINTMENT DATE: 05/22/17  NEXT APPOINTMENT DATE: N/A   OTHER COMMENTS:    **Let patient know to contact pharmacy at the end of the day to make sure medication is ready. **  ** Please notify patient to allow 48-72 hours to process**  **Encourage patient to contact the pharmacy for refills or they can request refills through [facility]**

## 2017-07-09 NOTE — Telephone Encounter (Signed)
Please advise on refill.

## 2017-07-09 NOTE — Addendum Note (Signed)
Addended by: Marian Sorrow on: 07/09/2017 02:26 PM   Modules accepted: Orders

## 2017-07-09 NOTE — Telephone Encounter (Signed)
Shannon Foster pt requesting Rx for Maxalt. Please advise if okay to fill?

## 2017-07-09 NOTE — Telephone Encounter (Signed)
Patient called in checking on Rx below. Call patient and advise when filled.

## 2017-07-10 ENCOUNTER — Ambulatory Visit (INDEPENDENT_AMBULATORY_CARE_PROVIDER_SITE_OTHER): Payer: BLUE CROSS/BLUE SHIELD | Admitting: Physician Assistant

## 2017-07-10 ENCOUNTER — Encounter: Payer: Self-pay | Admitting: Physician Assistant

## 2017-07-10 ENCOUNTER — Telehealth: Payer: Self-pay | Admitting: Physician Assistant

## 2017-07-10 VITALS — BP 110/80 | HR 94 | Temp 98.3°F | Ht 64.5 in | Wt 103.0 lb

## 2017-07-10 DIAGNOSIS — J392 Other diseases of pharynx: Secondary | ICD-10-CM

## 2017-07-10 MED ORDER — MAGIC MOUTHWASH: 5.0000 mL | Freq: Three times a day (TID) | ORAL | 0 refills | Status: DC | PRN

## 2017-07-10 MED ORDER — ZOLMITRIPTAN 2.5 MG PO TABS: 2.5000 mg | ORAL_TABLET | Freq: Once | ORAL | 0 refills | Status: DC

## 2017-07-10 NOTE — Telephone Encounter (Signed)
Patient states that she took maxalt yesterday around 2:30 and she felt like her throat and nasal passages were instantly on fire. She has taken this medicine before and has experienced throat tightening but never anything like this. I have scheduled her an appointment for 10 am.

## 2017-07-10 NOTE — Patient Instructions (Addendum)
Use the magic mouthwash as needed for throat pain.  I have changed you from Maxalt to Zomig.  Please push fluids.  If you develop any difficulty breathing, shortness of breath, or fever -- seek medical attention!

## 2017-07-10 NOTE — Progress Notes (Signed)
Shannon Foster is a 56 y.o. female here for a sore throat.  I acted as a Education administrator for [employer], PA-C Anselmo Pickler, LPN  History of Present Illness:   Chief Complaint  Patient presents with  . Sore Throat    Sore Throat   This is a new problem. The current episode started yesterday (Started yesterday around 2:30, 15 minutes after she took a Maxalt had buring in throat and nasal passages ). The problem has been gradually worsening. The pain is at a severity of 9/10. Associated symptoms include headaches and trouble swallowing. Pertinent negatives include no congestion, coughing, diarrhea, ear discharge, ear pain, shortness of breath or stridor. Associated symptoms comments: Nasal burning and runny nose. Has a migraine.. She has tried nothing for the symptoms.   She reports that her throat feels very raw. No cough or issues with SOB or stridor, endorses severe irritation. Burning with any time with swallowing. Has taken Zomig in the past and has not had issues with this. She states that she feels like she consumed plenty of water to get the medicine down and since. No issues with getting food down her throat, just discomfort. She does report that the night before she had to yell at her dog very loudly and is also wondering if she is experiencing vocal strain.   Past Medical History:  Diagnosis Date  . Fibrocystic breast disease   . History of shingles 2014  . Migraine   . Raynaud's disease      Social History   Social History  . Marital status: Divorced    Spouse name: N/A  . Number of children: N/A  . Years of education: N/A   Occupational History  . Not on file.   Social History Main Topics  . Smoking status: Never Smoker  . Smokeless tobacco: Never Used  . Alcohol use No  . Drug use: No  . Sexual activity: Yes    Partners: Male    Birth control/ protection: Surgical     Comment: TAH/BSO   Other Topics Concern  . Not on file   Social History Narrative   . No narrative on file    Past Surgical History:  Procedure Laterality Date  . ABDOMINAL HYSTERECTOMY    . HYSTEROSCOPY  2001   BSO- endometriosis  . OOPHORECTOMY Bilateral    with TAH  . PELVIC LAPAROSCOPY  3x    endometriosis, LOA    Family History  Problem Relation Age of Onset  . Parkinson's disease Father   . Hypertension Mother   . Alzheimer's disease Mother   . Cancer Brother        prostate  . Suicidality Sister     Allergies  Allergen Reactions  . Codeine Nausea And Vomiting    Current Medications:   Current Outpatient Prescriptions:  .  cholecalciferol (VITAMIN D) 1000 units tablet, Take 1,000 Units by mouth daily., Disp: , Rfl:  .  FINACEA 15 % cream, APPLY GEL ONCE OR TWICE DAILY TO FACE., Disp: , Rfl: 2 .  magnesium 30 MG tablet, Take 30 mg by mouth daily., Disp: , Rfl:  .  magic mouthwash SOLN, Take 5 mLs by mouth 3 (three) times daily as needed for mouth pain., Disp: 120 mL, Rfl: 0 .  neomycin-polymyxin b-dexamethasone (MAXITROL) 3.5-10000-0.1 SUSP, Place 1 drop into both eyes every 6 (six) hours. (Patient not taking: Reported on 07/10/2017), Disp: 5 mL, Rfl: 1 .  ZOLMitriptan (ZOMIG) 2.5 MG tablet, Take 1 tablet (  2.5 mg total) by mouth once. May repeat in 2 hours if headache persists or recurs., Disp: 10 tablet, Rfl: 0   Review of Systems:   Review of Systems  Constitutional: Negative for chills, fever, malaise/fatigue and weight loss.  HENT: Positive for sore throat and trouble swallowing. Negative for congestion, ear discharge, ear pain and hearing loss.   Eyes: Negative for blurred vision.  Respiratory: Negative for cough, sputum production, shortness of breath and stridor.   Gastrointestinal: Negative for diarrhea.  Neurological: Positive for headaches.    Vitals:   Vitals:   07/10/17 0954  BP: 110/80  Pulse: 94  Temp: 98.3 F (36.8 C)  TempSrc: Oral  SpO2: 100%  Weight: 103 lb (46.7 kg)  Height: 5' 4.5" (1.638 m)     Body mass index  is 17.41 kg/m.  Physical Exam:   Physical Exam  Constitutional: She appears well-developed. She is cooperative.  Non-toxic appearance. She does not have a sickly appearance. She does not appear ill. No distress.  HENT:  Head: Normocephalic and atraumatic.  Right Ear: Tympanic membrane, external ear and ear canal normal. Tympanic membrane is not erythematous, not retracted and not bulging.  Left Ear: Tympanic membrane, external ear and ear canal normal. Tympanic membrane is not erythematous, not retracted and not bulging.  Nose: Mucosal edema and rhinorrhea present. No nasal deformity. Right sinus exhibits no maxillary sinus tenderness and no frontal sinus tenderness. Left sinus exhibits no maxillary sinus tenderness and no frontal sinus tenderness.  Mouth/Throat: Uvula is midline. Posterior oropharyngeal erythema present. No posterior oropharyngeal edema.  Eyes: Conjunctivae and lids are normal.  Neck: Trachea normal.  Cardiovascular: Normal rate, regular rhythm, S1 normal, S2 normal and normal heart sounds.   Pulmonary/Chest: Effort normal and breath sounds normal. She has no decreased breath sounds. She has no wheezes. She has no rhonchi. She has no rales.  Lymphadenopathy:    She has no cervical adenopathy.  Neurological: She is alert.  Skin: Skin is warm, dry and intact.  Psychiatric: She has a normal mood and affect. Her speech is normal and behavior is normal.  Nursing note and vitals reviewed.   Assessment and Plan:    Dorethea was seen today for sore throat.  Diagnoses and all orders for this visit:  Throat irritation Exam showed throat erythema but no other concerning findings. Vitals stable. Prescribed magic mouthwash to use prn. Push fluids. Discussed warning signs -- stridor, SOB, fever and when to seek medical attention. Unable to determine if she is having a reaction to the maxalt vs vocal strain vs viral pharyngitis. Will change Maxalt to Zomig as she feels more  comfortable taking this medication. Rest voice.  Other orders -     ZOLMitriptan (ZOMIG) 2.5 MG tablet; Take 1 tablet (2.5 mg total) by mouth once. May repeat in 2 hours if headache persists or recurs. -     magic mouthwash SOLN; Take 5 mLs by mouth 3 (three) times daily as needed for mouth pain.    . Reviewed expectations re: course of current medical issues. . Discussed self-management of symptoms. . Outlined signs and symptoms indicating need for more acute intervention. . Patient verbalized understanding and all questions were answered. . See orders for this visit as documented in the electronic medical record. . Patient received an After-Visit Summary.  CMA or LPN served as scribe during this visit. History, Physical, and Plan performed by medical provider. Documentation and orders reviewed and attested to.  Inda Coke, PA-C

## 2017-08-04 ENCOUNTER — Encounter: Payer: Self-pay | Admitting: Obstetrics and Gynecology

## 2017-10-22 ENCOUNTER — Encounter: Payer: Self-pay | Admitting: Family Medicine

## 2017-10-22 ENCOUNTER — Ambulatory Visit: Payer: BLUE CROSS/BLUE SHIELD | Admitting: Family Medicine

## 2017-10-22 ENCOUNTER — Ambulatory Visit (INDEPENDENT_AMBULATORY_CARE_PROVIDER_SITE_OTHER): Payer: BLUE CROSS/BLUE SHIELD

## 2017-10-22 VITALS — BP 104/82 | HR 93 | Temp 98.5°F | Wt 103.8 lb

## 2017-10-22 DIAGNOSIS — R1084 Generalized abdominal pain: Secondary | ICD-10-CM

## 2017-10-22 DIAGNOSIS — K5900 Constipation, unspecified: Secondary | ICD-10-CM | POA: Diagnosis not present

## 2017-10-22 DIAGNOSIS — R109 Unspecified abdominal pain: Secondary | ICD-10-CM | POA: Diagnosis not present

## 2017-10-22 LAB — POC URINALSYSI DIPSTICK (AUTOMATED)
Bilirubin, UA: NEGATIVE
Blood, UA: NEGATIVE
Glucose, UA: NEGATIVE
Ketones, UA: NEGATIVE
Leukocytes, UA: NEGATIVE
Nitrite, UA: NEGATIVE
Protein, UA: NEGATIVE
Spec Grav, UA: 1.01 (ref 1.010–1.025)
Urobilinogen, UA: 0.2 E.U./dL
pH, UA: 6 (ref 5.0–8.0)

## 2017-10-22 NOTE — Patient Instructions (Addendum)
MANAGEMENT OF CHRONIC CONSTIPATION   Push fluids, all day, everyday  Eat lots of high fiber foods-fruits, veggies, bran and whole grain instead of white bread  Be active everyday. Inactivity makes constipation worse.  Add psyllium daily (Metamucil) which comes in capsules now. Start very low dose and work up to recommended dose on bottle daily.  If that is not working, I would start Miralax, which you can buy in generic 17 gms daily. It's a powder and not an "addictive laxative". Take it every day and titrate the dose up or down to get the daily BM.  MANAGEMENT OF ACUTE CONSTIPATION  Milk of Magnesia if you need it to clear things out rarely. You can expect to have a bowel movement within six hours of taking milk of magnesia.

## 2017-10-22 NOTE — Progress Notes (Signed)
Shannon Foster is a 56 y.o. female here for an acute visit.  History of Present Illness:   I,Tanishka Drolet Designer, industrial/product as a Education administrator for Briscoe Deutscher, DO.,have documented all relevant documentation on the behalf of Briscoe Deutscher, DO,as directed by  Briscoe Deutscher, DO while in the presence of Briscoe Deutscher, DO.  Constipation  This is a new problem. The current episode started in the past 7 days. The problem has been gradually worsening since onset. There has been adequate water intake. Associated symptoms include abdominal pain, anorexia, back pain and bloating. Pertinent negatives include no diarrhea, difficulty urinating, fecal incontinence, fever, flatus, hematochezia, hemorrhoids, melena, nausea, rectal pain, vomiting or weight loss. Risk factors include stress, recent illness and dietary change. She has tried diet changes for the symptoms. The treatment provided no relief.   PMHx, SurgHx, SocialHx, Medications, and Allergies were reviewed in the Visit Navigator and updated as appropriate.  Current Medications:   .  cholecalciferol (VITAMIN D) 1000 units tablet, Take 1,000 Units by mouth daily., Disp: , Rfl:  .  FINACEA 15 % cream, APPLY GEL ONCE OR TWICE DAILY TO FACE., Disp: , Rfl: 2 .  magic mouthwash SOLN, Take 5 mLs by mouth 3 (three) times daily as needed for mouth pain., Disp: 120 mL, Rfl: 0 .  magnesium 30 MG tablet, Take 30 mg by mouth daily., Disp: , Rfl:  .  neomycin-polymyxin b-dexamethasone (MAXITROL) 3.5-10000-0.1 SUSP, Place 1 drop into both eyes every 6 (six) hours. (Patient not taking: Reported on 07/10/2017), Disp: 5 mL, Rfl: 1 .  ZOLMitriptan (ZOMIG) 2.5 MG tablet, Take 1 tablet (2.5 mg total) by mouth once. May repeat in 2 hours if headache persists or recurs., Disp: 10 tablet, Rfl: 0   Allergies  Allergen Reactions  . Codeine Nausea And Vomiting   Review of Systems:   Pertinent items are noted in the HPI. Otherwise, ROS is negative.  Vitals:   Vitals:   10/22/17 1059  BP: 104/82  Pulse: 93  Temp: 98.5 F (36.9 C)  TempSrc: Oral  SpO2: 98%  Weight: 103 lb 12.8 oz (47.1 kg)     Body mass index is 17.54 kg/m.  Physical Exam:   Physical Exam  Constitutional: She is oriented to person, place, and time. She appears well-developed and well-nourished. No distress.  HENT:  Head: Normocephalic and atraumatic.  Right Ear: External ear normal.  Left Ear: External ear normal.  Nose: Nose normal.  Mouth/Throat: Oropharynx is clear and moist.  Eyes: Conjunctivae and EOM are normal. Pupils are equal, round, and reactive to light.  Neck: Normal range of motion. Neck supple. No thyromegaly present.  Cardiovascular: Normal rate, regular rhythm, normal heart sounds and intact distal pulses.  Pulmonary/Chest: Effort normal and breath sounds normal.  Abdominal: Soft. Bowel sounds are normal.  Musculoskeletal: Normal range of motion.  Lymphadenopathy:    She has no cervical adenopathy.  Neurological: She is alert and oriented to person, place, and time.  Skin: Skin is warm and dry. Capillary refill takes less than 2 seconds.  Psychiatric: She has a normal mood and affect. Her behavior is normal.  Nursing note and vitals reviewed.   Results for orders placed or performed in visit on 10/22/17  POCT Urinalysis Dipstick (Automated)  Result Value Ref Range   Color, UA Yellow    Clarity, UA Clear    Glucose, UA Negative    Bilirubin, UA Negative    Ketones, UA Negative    Spec Grav, UA 1.010 1.010 -  1.025   Blood, UA Negative    pH, UA 6.0 5.0 - 8.0   Protein, UA Negative    Urobilinogen, UA 0.2 0.2 or 1.0 E.U./dL   Nitrite, UA Negative    Leukocytes, UA Negative Negative   Assessment and Plan:   1. Constipation, unspecified constipation type No red flags. See AVS for treatment discussed.   2. Generalized abdominal pain KUB without obstruction. UA clear. See AVS.  - POCT Urinalysis Dipstick (Automated) - DG Abd 1 View   . Reviewed  expectations re: course of current medical issues. . Discussed self-management of symptoms. . Outlined signs and symptoms indicating need for more acute intervention. . Patient verbalized understanding and all questions were answered. Marland Kitchen Health Maintenance issues including appropriate healthy diet, exercise, and smoking avoidance were discussed with patient. . See orders for this visit as documented in the electronic medical record. . Patient received an After Visit Summary.  Briscoe Deutscher, DO [place], [place] [facility] 10/22/2017

## 2017-10-23 ENCOUNTER — Telehealth: Payer: Self-pay | Admitting: Physician Assistant

## 2017-10-23 NOTE — Telephone Encounter (Signed)
Please be advise. Appointment notes state abdominal pains however, they may be related to the constipation, I did not see a triage note.   Copied from [place] [phone]. Topic: Quick Communication - See Telephone Encounter >> Oct 23, 2017  4:25 PM Boyd Kerbs wrote: CRM for notification. See Telephone encounter for:  Patient is calling back saying symptoms are worse.  Constipation, nausea are worse.  Doctor wanted her to call and let her know. She is asking what to do 10/23/17. >> Oct 23, 2017  4:30 PM Marin Olp L wrote: Patient decided to schedule an appt

## 2017-10-23 NOTE — Telephone Encounter (Signed)
Copied from [place] [phone]. Topic: Quick Communication - See Telephone Encounter >> Oct 23, 2017  4:25 PM Boyd Kerbs wrote: CRM for notification. See Telephone encounter for:  Patient is calling back saying symptoms are worse.  Constipation, nausea are worse.  Doctor wanted her to call and let her know. She is asking what to do 10/23/17.

## 2017-10-24 ENCOUNTER — Ambulatory Visit: Payer: BLUE CROSS/BLUE SHIELD | Admitting: Family Medicine

## 2017-10-24 ENCOUNTER — Encounter: Payer: Self-pay | Admitting: Family Medicine

## 2017-10-24 VITALS — BP 112/70 | HR 92 | Temp 98.4°F | Ht 64.5 in | Wt 104.6 lb

## 2017-10-24 DIAGNOSIS — K5900 Constipation, unspecified: Secondary | ICD-10-CM | POA: Diagnosis not present

## 2017-10-24 NOTE — Telephone Encounter (Signed)
Patient has appointment with you today at 2 pm

## 2017-10-24 NOTE — Progress Notes (Signed)
    Subjective:  Shannon Foster is a 56 y.o. female who presents today with a chief complaint of constipation.   HPI:  Constipation, established problem, improving Patient was seen 2 days ago with abdominal pain.  She was diagnosed with constipation.  She was instructed to start a bowel regimen including MiraLAX and high-fiber diet.  Over the last couple days her symptoms have improved a little bit.  She has had one bowel movement this morning which was hard to pass.  Nausea is about the same as her last visit.  Patient is concerned because she was expecting more rapid improvement with.  ROS: No skin or hair changes, otherwise per HPI   Objective:  Physical Exam: BP 112/70 (BP Location: Left Arm, Patient Position: Sitting, Cuff Size: Normal)   Pulse 92   Temp 98.4 F (36.9 C) (Oral)   Ht 5' 4.5" (1.638 m)   Wt 104 lb 9.6 oz (47.4 kg)   SpO2 98%   BMI 17.68 kg/m   Gen: NAD, resting comfortably GI: Bowel sounds present.  Soft, nontender, nondistended.  Assessment/Plan:  Constipation Improving.  Reassured patient.  Discussed typical course of recovery and the cyclical nature of untreated constipation and that it sometimes can take weeks to months to fully resolve.  We will continue with her current treatment plan.  Do not need to add any other medications at this point as her symptoms are improving.  Return precautions reviewed.  Time Spent: I spent 15 minutes face-to-face with the patient, with more than half spent on reviewing her abdominal x-ray, and counseling for the etiology for her diarrhea as well as a reasonable timeline to expect improvement in her symptoms.  Algis Greenhouse. Jerline Pain, MD 10/24/2017 2:42 PM

## 2017-10-30 ENCOUNTER — Encounter (HOSPITAL_COMMUNITY): Payer: Self-pay | Admitting: Emergency Medicine

## 2017-10-30 ENCOUNTER — Ambulatory Visit: Payer: Self-pay

## 2017-10-30 ENCOUNTER — Emergency Department (HOSPITAL_COMMUNITY)
Admission: EM | Admit: 2017-10-30 | Discharge: 2017-10-31 | Disposition: A | Discharge status: Home / Self Care | Payer: BLUE CROSS/BLUE SHIELD | Attending: Emergency Medicine | Admitting: Emergency Medicine

## 2017-10-30 ENCOUNTER — Encounter: Payer: Self-pay | Admitting: Gastroenterology

## 2017-10-30 DIAGNOSIS — K529 Noninfective gastroenteritis and colitis, unspecified: Secondary | ICD-10-CM | POA: Diagnosis not present

## 2017-10-30 DIAGNOSIS — R197 Diarrhea, unspecified: Secondary | ICD-10-CM | POA: Diagnosis not present

## 2017-10-30 DIAGNOSIS — Z79899 Other long term (current) drug therapy: Secondary | ICD-10-CM | POA: Insufficient documentation

## 2017-10-30 DIAGNOSIS — R109 Unspecified abdominal pain: Secondary | ICD-10-CM | POA: Diagnosis not present

## 2017-10-30 LAB — COMPREHENSIVE METABOLIC PANEL
ALBUMIN: 3.6 g/dL (ref 3.5–5.0)
ALT: 18 U/L (ref 14–54)
ANION GAP: 4 — AB (ref 5–15)
AST: 21 U/L (ref 15–41)
Alkaline Phosphatase: 54 U/L (ref 38–126)
BUN: 13 mg/dL (ref 6–20)
CO2: 26 mmol/L (ref 22–32)
Calcium: 8.3 mg/dL — ABNORMAL LOW (ref 8.9–10.3)
Chloride: 110 mmol/L (ref 101–111)
Creatinine, Ser: 0.8 mg/dL (ref 0.44–1.00)
GFR calc Af Amer: >60 mL/min (ref >60)
GFR calc non Af Amer: >60 mL/min (ref >60)
GLUCOSE: 89 mg/dL (ref 65–99)
POTASSIUM: 3.6 mmol/L (ref 3.5–5.1)
SODIUM: 140 mmol/L (ref 135–145)
Total Bilirubin: 0.5 mg/dL (ref 0.3–1.2)
Total Protein: 6.2 g/dL — ABNORMAL LOW (ref 6.5–8.1)

## 2017-10-30 LAB — CBC
HEMATOCRIT: 42.4 % (ref 36.0–46.0)
HEMOGLOBIN: 14.8 g/dL (ref 12.0–15.0)
MCH: 31 pg (ref 26.0–34.0)
MCHC: 34.9 g/dL (ref 30.0–36.0)
MCV: 88.9 fL (ref 78.0–100.0)
Platelets: 294 10*3/uL (ref 150–400)
RBC: 4.77 MIL/uL (ref 3.87–5.11)
RDW: 12 % (ref 11.5–15.5)
WBC: 8.3 10*3/uL (ref 4.0–10.5)

## 2017-10-30 LAB — LIPASE, BLOOD: Lipase: 34 U/L (ref 11–51)

## 2017-10-30 MED ORDER — SODIUM CHLORIDE 0.9 % IV BOLUS (SEPSIS)
1000.0000 mL | Freq: Once | INTRAVENOUS | Status: AC
  Administered 2017-10-31: 1000 mL via INTRAVENOUS

## 2017-10-30 MED ORDER — MORPHINE SULFATE (PF) 4 MG/ML IV SOLN: 4.0000 mg | Freq: Once | INTRAVENOUS | Status: DC

## 2017-10-30 MED ORDER — ONDANSETRON HCL 4 MG/2ML IJ SOLN: 4.0000 mg | Freq: Once | INTRAMUSCULAR | Status: DC

## 2017-10-30 NOTE — Telephone Encounter (Signed)
Patient seen Dr. Jerline Pain on 10/24/17.

## 2017-10-30 NOTE — ED Provider Notes (Signed)
[facility] DEPT Provider Note   CSN: [id number] Arrival date & time: 10/30/17  1635     History   Chief Complaint Chief Complaint  Patient presents with  . Abdominal Pain  . Back Pain  . Diarrhea    HPI Shannon Foster is a 57 y.o. female.  The history is provided by the patient.  Abdominal Pain   Associated symptoms include diarrhea.  Back Pain   Associated symptoms include abdominal pain.  Diarrhea   Associated symptoms include abdominal pain.  She has history of Raynaud's disease, vitamin D deficiency, fibrocystic breast disease and comes in with diarrhea for the last 3 days.  For approximately the last 10 days, she has been having some abdominal problems.  She had been on amoxicillin for about 3 weeks-prescribed by her endodontist.  She ended course on December 22.  Several days later, she started having constipation and was seen by her PCP.  She was treated with MiraLAX and add did have relief of her constipation.  Over the last 3 days, she has been having severe diarrhea stating used to watery bowel movements every hour.  There has been no blood or mucus.  There has been some diffuse abdominal pain with some radiation into the back.  She rates her pain a 10/10.  Pain is not improved by having a bowel movement.  She has been taking some antacids to try to get some relief, but has not achieved any relief.  She has been concerned about using things like loperamide because she did not want to go back to having constipation.  She denies fever or sweats but has had some chills.  There has been some mild nausea but no vomiting.  She denies any sick contacts.  Past Medical History:  Diagnosis Date  . Fibrocystic breast disease   . History of shingles 2014  . Migraine   . Raynaud's disease     Patient Active Problem List   Diagnosis Date Noted  . Osteopenia 05/22/2017  . Vitamin D deficiency 05/22/2017  . Migraine   . Raynaud's disease   .  Fibrocystic breast disease     Past Surgical History:  Procedure Laterality Date  . ABDOMINAL HYSTERECTOMY    . HYSTEROSCOPY  2001   BSO- endometriosis  . OOPHORECTOMY Bilateral    with TAH  . PELVIC LAPAROSCOPY  3x    endometriosis, LOA    OB History    Gravida Para Term Preterm AB Living   0             SAB TAB Ectopic Multiple Live Births                   Home Medications    Prior to Admission medications   Medication Sig Start Date End Date Taking? Authorizing Provider  cholecalciferol (VITAMIN D) 1000 units tablet Take 1,000 Units by mouth daily.    [provider]  FINACEA 15 % cream APPLY GEL ONCE OR TWICE DAILY TO FACE. 03/03/15   [provider]  magnesium 30 MG tablet Take 30 mg by mouth daily.    [provider]  ZOLMitriptan (ZOMIG) 2.5 MG tablet Take 1 tablet (2.5 mg total) by mouth once. May repeat in 2 hours if headache persists or recurs. 07/10/17 07/10/17  Inda Coke, PA    Family History Family History  Problem Relation Age of Onset  . Parkinson's disease Father   . Hypertension Mother   . Alzheimer's  disease Mother   . Cancer Brother        prostate  . Suicidality Sister     Social History Social History   Tobacco Use  . Smoking status: Never Smoker  . Smokeless tobacco: Never Used  Substance Use Topics  . Alcohol use: No  . Drug use: No     Allergies   Codeine   Review of Systems Review of Systems  Gastrointestinal: Positive for abdominal pain and diarrhea.  Musculoskeletal: Positive for back pain.  All other systems reviewed and are negative.    Physical Exam Updated Vital Signs BP (!) 132/92   Pulse 93   Temp 99.2 F (37.3 C) (Oral)   Resp 18   SpO2 100%   Physical Exam  Nursing note and vitals reviewed.  57 year old female, resting comfortably and in no acute distress. Vital signs are significant for borderline hypertension. Oxygen saturation is 100%, which is normal. Head is  normocephalic and atraumatic. PERRLA, EOMI. Oropharynx is clear. Neck is nontender and supple without adenopathy or JVD. Back is nontender and there is no CVA tenderness. Lungs are clear without rales, wheezes, or rhonchi. Chest is nontender. Heart has regular rate and rhythm without murmur. Abdomen is soft, flat, with moderate tenderness in the right lower quadrant, right upper quadrant, left upper quadrant.  There is no rebound or guarding.  There are no masses or hepatosplenomegaly and peristalsis is slightly hypoactive. Extremities have no cyanosis or edema, full range of motion is present. Skin is warm and dry without rash. Neurologic: Mental status is normal, cranial nerves are intact, there are no motor or sensory deficits.  ED Treatments / Results  Labs (all labs ordered are listed, but only abnormal results are displayed) Labs Reviewed  COMPREHENSIVE METABOLIC PANEL - Abnormal; Notable for the following components:      Result Value   Calcium 8.3 (*)    Total Protein 6.2 (*)    Anion gap 4 (*)    All other components within normal limits  C DIFFICILE QUICK SCREEN W PCR REFLEX  GASTROINTESTINAL PANEL BY PCR, STOOL (REPLACES STOOL CULTURE)  LIPASE, BLOOD  CBC  URINALYSIS, ROUTINE W REFLEX MICROSCOPIC   Radiology Ct Abdomen Pelvis W Contrast  Result Date: 10/31/2017 CLINICAL DATA:  Abdominal pain radiating to the back.  Diarrhea. EXAM: CT ABDOMEN AND PELVIS WITH CONTRAST TECHNIQUE: Multidetector CT imaging of the abdomen and pelvis was performed using the standard protocol following bolus administration of intravenous contrast. CONTRAST:  100 mL Isovue-300 COMPARISON:  None. FINDINGS: Lower chest: The lung bases are clear. Hepatobiliary: Subcentimeter low-attenuation lesions in the liver likely represent cysts or hemangiomas. No solid lesions identified. Gallbladder and bile ducts are unremarkable. Pancreas: Unremarkable. No pancreatic ductal dilatation or surrounding  inflammatory changes. Spleen: Normal in size without focal abnormality. Adrenals/Urinary Tract: Adrenal glands are unremarkable. Kidneys are normal, without renal calculi, focal lesion, or hydronephrosis. Bladder is distended, possibly indicating urinary retention. No wall thickening. Stomach/Bowel: Diffuse wall thickening and mural edema throughout the colon. This pattern is nonspecific but most likely to represent pseudomembranous colitis. Other forms of infectious or inflammatory colitis would be considered less likely. Small bowel are not abnormally distended. Stomach is unremarkable. Appendix is not identified. Vascular/Lymphatic: No significant vascular findings are present. No enlarged abdominal or pelvic lymph nodes. Reproductive: Status post hysterectomy. No adnexal masses. Other: No abdominal wall hernia or abnormality. No abdominopelvic ascites. Musculoskeletal: No acute or significant osseous findings. IMPRESSION: 1. Diffuse colonic wall thickening and  edema most likely to represent pseudomembranous colitis. Other forms of infectious or inflammatory colitis could also have this appearance. 2. Subcentimeter low-attenuation lesions in the liver likely represent cysts or hemangiomas. Electronically Signed   By: Lucienne Capers M.D.   On: 10/31/2017 03:22    Procedures Procedures (including critical care time)  Medications Ordered in ED Medications  morphine 4 MG/ML injection 4 mg (4 mg Intravenous Refused 10/31/17 0128)  ondansetron (ZOFRAN) injection 4 mg (4 mg Intravenous Refused 10/31/17 0129)  sodium chloride 0.9 % bolus 1,000 mL (1,000 mLs Intravenous New Bag/Given 10/31/17 0128)  iopamidol (ISOVUE-300) 61 % injection 30 mL (30 mLs Oral Contrast Given 10/31/17 0113)  iopamidol (ISOVUE-300) 61 % injection 100 mL (80 mLs Intravenous Contrast Given 10/31/17 0253)     Initial Impression / Assessment and Plan / ED Course  I have reviewed the triage vital signs and the nursing notes.  Pertinent  labs & imaging results that were available during my care of the patient were reviewed by me and considered in my medical decision making (see chart for details).  Abdominal pain with diarrhea.  Laboratory workup is unremarkable.  WBC is normal as are electrolytes and renal function.  I am concerned about the degree of pain that she is having.  She certainly could have antibiotic associated colitis, consider diverticulitis.  No recent travel to put her at risk for amoebic disease or enteropathogenic E. coli.  Will send stool for testing and will send for CT of abdomen and pelvis.  Old records reviewed confirming recent office visits for management of her constipation.  Clostridium difficile test is negative.  Laboratory workup is unremarkable with normal CBC and differential.  Electrolytes are also normal in spite of voluminous diarrhea.  Gastrointestinal panel is pending.  CT shows evidence of colitis with suggestion of pseudomembranous colitis.  However, amoxicillin is very unlikely to cause pseudomembranous colitis.  Clinically, patient is well-appearing.  Abdominal exam is repeated, and there is only minimal tenderness.  Patient was offered options of hospital admission pending results of gastrointestinal panel versus care at home.  Patient has elected to go home.  Strict return precautions were discussed.  As of now, will hold on giving antibiotics until clear diagnosis is made through gastrointestinal stool panel.  Follow-up with PCP in 3 days, return sooner if not improving or if getting worse.  Final Clinical Impressions(s) / ED Diagnoses   Final diagnoses:  Colitis, nonspecific    ED Discharge Orders        Ordered    traMADol (ULTRAM) 50 MG tablet  Every 6 hours PRN     94/49/67 [id number]       Delora Fuel, MD 38/46/65 [phone]

## 2017-10-30 NOTE — Telephone Encounter (Signed)
Pt. Called to report severe diarrhea for past 3 days, and intermittent severe abdominal pain across lower abdomen  and back.  C/o chills.  Hasn't checked temperature.  Stated she was on Amoxicillin for 4 weeks for an Endodontal issue.  Denied nausea or vomiting.  Reported she is taking in water orally, and just started drinking Gatorade.  Reported she is having watery dark brown to yellow stools hourly x 3 days.  Reported feeling more weak, very sleepy, and having increase dryness in mouth.  Reported she thinks she is urinating in adequate amts.  Stated she has a friend that is a doctor, and he advised her to have a stool checked for C-Difficile. Per protocol, advised pt. she should go to the ER.  Pt. Verb. Understanding.  Plans to have her brother take her to [facility] ER.         Reason for Disposition . [1] SEVERE abdominal pain (e.g., excruciating) AND [2] present > 1 hour  Answer Assessment - Initial Assessment Questions 1. DIARRHEA SEVERITY: "How bad is the diarrhea?" "How many extra stools have you had in the past 24 hours than normal?"    - MILD: Few loose or mushy BMs; increase of 1-3 stools over normal daily number of stools; mild increase in ostomy output.   - MODERATE: Increase of 4-6 stools daily over normal; moderate increase in ostomy output.   - SEVERE (or Worst Possible): Increase of 7 or more stools daily over normal; moderate increase in ostomy output; incontinence.     Watery stools about every hour x 3 days ; dark brown to yellow with flecks  2. ONSET: "When did the diarrhea begin?"      3 days ago 3. BM CONSISTENCY: "How loose or watery is the diarrhea?"      Very watery 4. VOMITING: "Are you also vomiting?" If so, ask: "How many times in the past 24 hours?"      No 5. ABDOMINAL PAIN: "Are you having any abdominal pain?" If yes: "What does it feel like?" (e.g., crampy, dull, intermittent, constant)      Across lower abdomen and back  6. ABDOMINAL PAIN SEVERITY: If present,  ask: "How bad is the pain?"  (e.g., Scale 1-10; mild, moderate, or severe)    - MILD (1-3): doesn't interfere with normal activities, abdomen soft and not tender to touch     - MODERATE (4-7): interferes with normal activities or awakens from sleep, tender to touch     - SEVERE (8-10): excruciating pain, doubled over, unable to do any normal activities       severe 7. ORAL INTAKE: If vomiting, "Have you been able to drink liquids?" "How much fluids have you had in the past 24 hours?"     Taking fluids; water and gatorade 8. HYDRATION: "Any signs of dehydration?" (e.g., dry mouth [not just dry lips], too weak to stand, dizziness, new weight loss) "When did you last urinate?"     Very sleepy, increased weak, very dry mouth; feels like she is urinating at this time.  9. EXPOSURE: "Have you traveled to a foreign country recently?" "Have you been exposed to anyone with diarrhea?" "Could you have eaten any food that was spoiled?"    No 10. OTHER SYMPTOMS: "Do you have any other symptoms?" (e.g., fever, blood in stool)      Chills; no blood in stools 11. PREGNANCY: "Is there any chance you are pregnant?" "When was your last menstrual period?"  No ; hysterectomy about 15 yrs ago  Protocols used: DIARRHEA-A-AH

## 2017-10-30 NOTE — Telephone Encounter (Signed)
See note

## 2017-10-30 NOTE — ED Triage Notes (Signed)
Patient reports that she was on antibiotics before Christmas and since then she has had abd pain that radiates to back and diarrhea. Denies urinary problems or vomiting.

## 2017-10-30 NOTE — Telephone Encounter (Signed)
Noted.  Patient is now in the ED.

## 2017-10-31 ENCOUNTER — Ambulatory Visit: Payer: BLUE CROSS/BLUE SHIELD | Admitting: Family Medicine

## 2017-10-31 ENCOUNTER — Emergency Department (HOSPITAL_COMMUNITY): Payer: BLUE CROSS/BLUE SHIELD

## 2017-10-31 DIAGNOSIS — R109 Unspecified abdominal pain: Secondary | ICD-10-CM | POA: Diagnosis not present

## 2017-10-31 DIAGNOSIS — R197 Diarrhea, unspecified: Secondary | ICD-10-CM | POA: Diagnosis not present

## 2017-10-31 LAB — C DIFFICILE QUICK SCREEN W PCR REFLEX
C Diff antigen: NEGATIVE
C Diff interpretation: NOT DETECTED
C Diff toxin: NEGATIVE

## 2017-10-31 LAB — GASTROINTESTINAL PANEL BY PCR, STOOL (REPLACES STOOL CULTURE)

## 2017-10-31 MED ORDER — TRAMADOL HCL 50 MG PO TABS: 50.0000 mg | ORAL_TABLET | Freq: Four times a day (QID) | ORAL | 0 refills | Status: DC | PRN

## 2017-10-31 MED ORDER — IOPAMIDOL (ISOVUE-300) INJECTION 61%
30.0000 mL | Freq: Once | INTRAVENOUS | Status: AC | PRN
  Administered 2017-10-31: 30 mL via ORAL

## 2017-10-31 MED ORDER — IOPAMIDOL (ISOVUE-300) INJECTION 61%
100.0000 mL | Freq: Once | INTRAVENOUS | Status: AC | PRN
  Administered 2017-10-31: 80 mL via INTRAVENOUS

## 2017-10-31 MED ORDER — IOPAMIDOL (ISOVUE-300) INJECTION 61%
INTRAVENOUS | Status: AC
  Administered 2017-10-31: 80 mL via INTRAVENOUS
  Filled 2017-10-31: qty 100

## 2017-10-31 MED ORDER — IOPAMIDOL (ISOVUE-300) INJECTION 61%
INTRAVENOUS | Status: AC
  Administered 2017-10-31: 30 mL via ORAL
  Filled 2017-10-31: qty 30

## 2017-10-31 NOTE — Discharge Instructions (Signed)
Take Kaopectate or Pepto-Bismol as needed to try to control your diarrhea.  Do not take loperamide until results of come back and we are sure that it is not something that loperamide might make worse.  Return to the emergency department if pain is getting worse, or if you start running a fever, or if you start vomiting.  Also, consider return to emergency department if you are not showing a trend toward improvement over the next 2 days.  Please use my chart to find out the results of the stool panel.  Once those results are available, call your primary care provider to see if any specific treatment should be initiated.  Return to emergency department if you have any concerns.

## 2017-10-31 NOTE — ED Notes (Signed)
Pt attempted to give urine sample, but was unable to.

## 2017-11-07 ENCOUNTER — Encounter: Payer: Self-pay | Admitting: Gastroenterology

## 2017-11-07 ENCOUNTER — Ambulatory Visit: Payer: BLUE CROSS/BLUE SHIELD | Admitting: Gastroenterology

## 2017-11-07 ENCOUNTER — Other Ambulatory Visit: Payer: BLUE CROSS/BLUE SHIELD

## 2017-11-07 VITALS — BP 100/78 | HR 94 | Ht 64.0 in | Wt 103.4 lb

## 2017-11-07 DIAGNOSIS — R197 Diarrhea, unspecified: Secondary | ICD-10-CM

## 2017-11-07 DIAGNOSIS — K529 Noninfective gastroenteritis and colitis, unspecified: Secondary | ICD-10-CM

## 2017-11-07 MED ORDER — COLESTIPOL HCL 1 G PO TABS: 1.0000 g | ORAL_TABLET | Freq: Two times a day (BID) | ORAL | 3 refills | Status: DC

## 2017-11-07 MED ORDER — DEXLANSOPRAZOLE 30 MG PO CPDR: 30.0000 mg | DELAYED_RELEASE_CAPSULE | Freq: Every day | ORAL | 3 refills | Status: DC

## 2017-11-07 MED ORDER — HYOSCYAMINE SULFATE ER 0.375 MG PO TB12: 0.3750 mg | ORAL_TABLET | Freq: Two times a day (BID) | ORAL | 3 refills | Status: DC

## 2017-11-07 MED ORDER — VSL#3 PO CAPS: ORAL_CAPSULE | ORAL | 0 refills | Status: DC

## 2017-11-07 MED ORDER — RANITIDINE HCL 150 MG PO TABS: 150.0000 mg | ORAL_TABLET | Freq: Every day | ORAL | 3 refills | Status: DC

## 2017-11-07 NOTE — Progress Notes (Signed)
Shannon Foster    [id number]    [birth year]  Primary Care Physician:[facility], [place], [place]  Referring Physician: Inda Coke, [address] [place], [place] [address]  Chief complaint:  Diarrhea, abdominal discomfort  HPI: 57 yr F here for evaluation with complaints of worsening diarrhea after she took 3 courses of antibiotics for periodontal infection and abscess within last 2 months.  She started having diarrhea after she completed the third course of antibiotic a month ago.  She was having 6-8 loose watery bowel movements with nocturnal awakening and symptoms, somewhat improved to 4-6 semi-formed bowel movement in the past 1-2 weeks after she started taking probiotics and drinking Kefir. Last colonoscopy by  Dr Collene Mares 4-5 years ago was normal per patient, report not available during this visit to review. He has associated lower abdominal cramps and discomfort.  Denies any fever, chills, nausea, vomiting, mucus or blood per rectum. She was seen in the ER October 30, 2017 for worsening diarrhea.  C. difficile and GI pathogen panel negative CT abdomen and pelvis October 31, 2017 showed diffuse colonic wall thickening and edema concerning for colitis, infectious versus inflammatory bowel disease   Outpatient Encounter Medications as of 11/07/2017  Medication Sig  . cholecalciferol (VITAMIN D) 1000 units tablet Take 1,000 Units by mouth daily.  Marland Kitchen FINACEA 15 % cream APPLY GEL ONCE OR TWICE DAILY TO FACE.  . Magnesium 500 MG CAPS Take 500 mg by mouth daily.  . traMADol (ULTRAM) 50 MG tablet Take 1 tablet (50 mg total) by mouth every 6 (six) hours as needed.  Marland Kitchen ZOLMitriptan (ZOMIG) 2.5 MG tablet Take 1 tablet (2.5 mg total) by mouth once. May repeat in 2 hours if headache persists or recurs.  . [DISCONTINUED] neomycin-polymyxin b-dexamethasone (MAXITROL) 3.5-10000-0.1 SUSP Place 1 drop into both eyes every 6 (six) hours as needed for irritation.   No  facility-administered encounter medications on file as of 11/07/2017.     Allergies as of 11/07/2017 - Review Complete 11/07/2017  Allergen Reaction Noted  . Codeine Nausea And Vomiting 02/23/2013    Past Medical History:  Diagnosis Date  . Fibrocystic breast disease   . History of shingles 2014  . Migraine   . Raynaud's disease     Past Surgical History:  Procedure Laterality Date  . HYSTEROSCOPY  2001   BSO- endometriosis  . PELVIC LAPAROSCOPY  3x    endometriosis, LOA  . TOTAL ABDOMINAL HYSTERECTOMY      Family History  Problem Relation Age of Onset  . Parkinson's disease Father   . Hypertension Mother   . Dementia Mother   . Prostate cancer Brother   . Suicidality Sister     Social History   Socioeconomic History  . Marital status: Divorced    Spouse name: Not on file  . Number of children: 0  . Years of education: Not on file  . Highest education level: Not on file  Social Needs  . Financial resource strain: Not on file  . Food insecurity - worry: Not on file  . Food insecurity - inability: Not on file  . Transportation needs - medical: Not on file  . Transportation needs - non-medical: Not on file  Occupational History  . Not on file  Tobacco Use  . Smoking status: Never Smoker  . Smokeless tobacco: Never Used  Substance and Sexual Activity  . Alcohol use: Yes    Comment: occasional wine  .  Drug use: No  . Sexual activity: Yes    Partners: Male    Birth control/protection: Surgical    Comment: TAH/BSO  Other Topics Concern  . Not on file  Social History Narrative  . Not on file      Review of systems: Review of Systems  Constitutional: Negative for fever and chills.  HENT: Negative.   Eyes: Negative for blurred vision.  Respiratory: Negative for cough, shortness of breath and wheezing.   Cardiovascular: Negative for chest pain and palpitations.  Gastrointestinal: as per HPI Genitourinary: Negative for dysuria, urgency, frequency and  hematuria.  Musculoskeletal: Negative for myalgias, back pain and joint pain.  Skin: Negative for itching and rash.  Neurological: Negative for dizziness, tremors, focal weakness, seizures and loss of consciousness.  Endo/Heme/Allergies: Positive for seasonal allergies.  Psychiatric/Behavioral: Negative for depression, suicidal ideas and hallucinations.  All other systems reviewed and are negative.   Physical Exam: Vitals:   11/07/17 1400  BP: 100/78  Pulse: 94   Body mass index is 17.74 kg/m. Gen:      No acute distress HEENT:  EOMI, sclera anicteric Neck:     No masses; no thyromegaly Lungs:    Clear to auscultation bilaterally; normal respiratory effort CV:         Regular rate and rhythm; no murmurs Abd:      + bowel sounds; soft, non-tender; no palpable masses, no distension Ext:    No edema; adequate peripheral perfusion Skin:      Warm and dry; no rash Neuro: alert and oriented x 3 Psych: normal mood and affect  Data Reviewed:  Reviewed labs, radiology imaging, old records and pertinent past GI work up   Assessment and Plan/Recommendations: 57 year old female with diarrhea subsequent to antibiotic use for past 4 weeks C. difficile and GI pathogen panel negative 10/30/17 CT abdomen pelvis showed diffuse colonic wall thickening concerning for colitis and radiologist read it as ?pseudomembranous colitis We will repeat GI pathogen panel to exclude C. difficile colitis or other infectious etiology Obtain prior colonoscopy report to review Start probiotic VSL# 3, 112 billion units twice daily for 2-4 weeks If continues to have persistent diarrhea, will plan for colonoscopy with biopsies to evaluate further    K. Denzil Magnuson , MD [phone] Mon-Fri 8a-5p [phone] after 5p, weekends, holidays  CC: Inda Coke, [place]

## 2017-11-07 NOTE — Patient Instructions (Addendum)
If you are age 59 or older, your body mass index should be between 23-30. Your Body mass index is 17.74 kg/m. If this is out of the aforementioned range listed, please consider follow up with your Primary Care Provider.  If you are age 33 or younger, your body mass index should be between 19-25. Your Body mass index is 17.74 kg/m. If this is out of the aformentioned range listed, please consider follow up with your Primary Care Provider.   Your physician has requested that you go to the basement for the following lab work before leaving today:  GI pathogen panel  Please use VSL #3 twice daily.

## 2017-11-13 ENCOUNTER — Other Ambulatory Visit: Payer: Self-pay

## 2017-11-13 ENCOUNTER — Telehealth: Payer: Self-pay | Admitting: Gastroenterology

## 2017-11-13 MED ORDER — VSL#3 PO CAPS: ORAL_CAPSULE | ORAL | 1 refills | Status: DC

## 2017-11-13 NOTE — Telephone Encounter (Signed)
Patient has VSL#3 112.5 billion units. Recommended at her office visit that if the sample seemed to improve her symptoms, she should continue it for 2 to 4 weeks. Discussed checking for a discount card at the product website. Rx set to her pharmacy.

## 2017-11-13 NOTE — Telephone Encounter (Signed)
Patient wanting to know if she is suppose to keep taking probiotic that Dr.Nandigam gave her to try. Pt states it seems to be working well and she would like a prescription called in with okay with Dr.Nandigam.

## 2017-12-09 ENCOUNTER — Ambulatory Visit: Payer: BLUE CROSS/BLUE SHIELD | Admitting: Gastroenterology

## 2017-12-12 ENCOUNTER — Ambulatory Visit: Payer: BLUE CROSS/BLUE SHIELD | Admitting: Nurse Practitioner

## 2017-12-12 ENCOUNTER — Encounter: Payer: Self-pay | Admitting: Nurse Practitioner

## 2017-12-12 VITALS — BP 110/60 | HR 88 | Ht 64.0 in | Wt 103.0 lb

## 2017-12-12 DIAGNOSIS — R110 Nausea: Secondary | ICD-10-CM

## 2017-12-12 DIAGNOSIS — R197 Diarrhea, unspecified: Secondary | ICD-10-CM

## 2017-12-12 MED ORDER — VSL#3 PO CAPS: ORAL_CAPSULE | ORAL | 1 refills | Status: DC

## 2017-12-12 NOTE — Progress Notes (Signed)
      IMPRESSION and PLAN:     57 yo female with diarrhea and nausea after taking clindamycin for several weeks. C-diff was negative. Total resolution of symptoms on VSL #3. She took 3 weeks of VSL and then discontinued per our instructions. GI sx back within a week or two of stopping the VSL #3.  -patient restarted probiotics at half dose pending this office visit. Diarrhea has already resolved but nausea persists on once daily dosing of VSL#3.  She can restart the probiotics at previous dose of BID. Will try an additional 6-8 weeks of treatment. Will see her in follow up in a few weeks.    HPI:    Chief Complaint:  Recurrent nausea and diarrhea   Patient is a 57 yo female who Dr. Silverio Decamp has been following for diarrhea / nausea. She had taken clindamycin for 6 weeks and almost immediately following completion of antibiotics developed nausea, abdominal pain and diarrhea. We started her on probiotics VSL #3 a few weeks ago. She had complete resolution of nausea and diarrhea on VSL probiotics. Upon stopping the probiotics her sx returned immediately. She has since restarted the probiotics but only one a day. Stools have since normalized but nausea has returned. Nausea not really related to eating. She hasn't taken any more antibiotics. Her appetite is very good. No fevers.    Past Medical History:  Diagnosis Date  . Fibrocystic breast disease   . History of shingles 2014  . Migraine   . Raynaud's disease     Patient's surgical history, family medical history, social history, medications and allergies were all reviewed in Epic    Review of systems: no fevers, mild weight loss, no chest pain.    Physical Exam:     BP 110/60   Pulse 88   Ht 5\' 4"  (1.626 m)   Wt 103 lb (46.7 kg)   BMI 17.68 kg/m   GENERAL:  Thin white female in NAD PSYCH: :Pleasant, cooperative, normal affect EENT:  conjunctiva pink, mucous membranes moist, neck supple without masses CARDIAC:  RRR, no murmur  heard, no peripheral edema PULM: Normal respiratory effort, lungs CTA bilaterally, no wheezing ABDOMEN:  Nondistended, soft, nontender. No obvious masses, no hepatomegaly,  normal bowel sounds SKIN:  turgor, no lesions seen Musculoskeletal:  Normal muscle tone, normal strength NEURO: Alert and oriented x 3, no focal neurologic deficits   Tye Savoy , NP 12/12/2017, 2:40 PM

## 2017-12-12 NOTE — Patient Instructions (Addendum)
If you are age 59 or older, your body mass index should be between 23-30. Your Body mass index is 17.68 kg/m. If this is out of the aforementioned range listed, please consider follow up with your Primary Care Provider.  If you are age 87 or younger, your body mass index should be between 19-25. Your Body mass index is 17.68 kg/m. If this is out of the aformentioned range listed, please consider follow up with your Primary Care Provider.    Continue VSL # 3 twice daily for 2 months.  Requesting copy of colonoscopy from Dr. Lorie Apley office.  Follow up with Dr. Silverio Decamp on Mar 10, 2018 at 1:45 pm.  Thank you for choosing me and [facility].   Tye Savoy, NP

## 2017-12-15 ENCOUNTER — Encounter: Payer: Self-pay | Admitting: Nurse Practitioner

## 2017-12-16 NOTE — Progress Notes (Signed)
Reviewed and agree with documentation and assessment and plan. K. Veena Nandigam , MD   

## 2017-12-29 ENCOUNTER — Telehealth: Payer: Self-pay | Admitting: Gastroenterology

## 2017-12-29 NOTE — Telephone Encounter (Signed)
It is ok to take VSL#3 long term, if she is unable to get it in the future, we can look into alternative probiotics

## 2017-12-29 NOTE — Telephone Encounter (Signed)
Patient is off of the VSL#3 again. She has had an almost immediate return of the very soft stools, but not diarrhea. She has 4 to 5 of these very soft bowel movements. Complains of low abdominal pain "front and back."  She does not have nausea or fever. Please advise. VSL#3 is on back order due to litigation over the patent. I found her some VSL #3 at a pharmacy which she is going to pick up now.

## 2017-12-29 NOTE — Telephone Encounter (Signed)
Yes, once the 4 boxes are gone, she will need an alternative. What is comparable?

## 2017-12-30 NOTE — Telephone Encounter (Signed)
She can try Align plus Activia or culturelle or danactive yogurt to get some of the same kind of probiotic organisms. Thanks

## 2017-12-30 NOTE — Telephone Encounter (Signed)
Shared with the patient

## 2018-03-10 ENCOUNTER — Ambulatory Visit: Payer: BLUE CROSS/BLUE SHIELD | Admitting: Gastroenterology

## 2018-03-31 ENCOUNTER — Other Ambulatory Visit: Payer: Self-pay | Admitting: Physician Assistant

## 2018-03-31 NOTE — Telephone Encounter (Signed)
Pt requesting refill on Maxalt. Please advise.

## 2018-05-26 ENCOUNTER — Other Ambulatory Visit: Payer: Self-pay | Admitting: Physician Assistant

## 2018-08-07 ENCOUNTER — Other Ambulatory Visit: Payer: Self-pay | Admitting: Physician Assistant

## 2018-08-22 DIAGNOSIS — B349 Viral infection, unspecified: Secondary | ICD-10-CM | POA: Diagnosis not present

## 2018-09-09 DIAGNOSIS — Z1231 Encounter for screening mammogram for malignant neoplasm of breast: Secondary | ICD-10-CM | POA: Diagnosis not present

## 2018-10-12 ENCOUNTER — Encounter: Payer: Self-pay | Admitting: Obstetrics and Gynecology

## 2018-12-24 DIAGNOSIS — D485 Neoplasm of uncertain behavior of skin: Secondary | ICD-10-CM | POA: Diagnosis not present

## 2018-12-24 DIAGNOSIS — L570 Actinic keratosis: Secondary | ICD-10-CM | POA: Diagnosis not present

## 2018-12-24 DIAGNOSIS — L820 Inflamed seborrheic keratosis: Secondary | ICD-10-CM | POA: Diagnosis not present

## 2019-02-01 ENCOUNTER — Other Ambulatory Visit: Payer: Self-pay | Admitting: Physician Assistant

## 2019-02-01 NOTE — Telephone Encounter (Signed)
Pt left message on voicemail 02/01/2019 at 0956 requesting refill of rizatriptin.

## 2019-02-01 NOTE — Telephone Encounter (Signed)
Schedule doxy appointment, patient has not been seen in office since 2018 for an acute issue.

## 2019-02-02 ENCOUNTER — Ambulatory Visit (INDEPENDENT_AMBULATORY_CARE_PROVIDER_SITE_OTHER): Payer: BLUE CROSS/BLUE SHIELD | Admitting: Physician Assistant

## 2019-02-02 ENCOUNTER — Encounter: Payer: Self-pay | Admitting: Physician Assistant

## 2019-02-02 DIAGNOSIS — G43909 Migraine, unspecified, not intractable, without status migrainosus: Secondary | ICD-10-CM

## 2019-02-02 MED ORDER — RIZATRIPTAN BENZOATE 5 MG PO TABS: 5.0000 mg | ORAL_TABLET | ORAL | 2 refills | Status: DC | PRN

## 2019-02-02 NOTE — Progress Notes (Signed)
Virtual Visit via Video   I connected with Shannon Foster on 02/02/19 at 10:00 AM EDT by a video enabled telemedicine application and verified that I am speaking with the correct person using two identifiers. Location patient: Home Location provider: [facility], Office Persons participating in the virtual visit: [place], [place], [place], [place], [place].  I discussed the limitations of evaluation and management by telemedicine and the availability of in person appointments. The patient expressed understanding and agreed to proceed.  Subjective:   HPI:  Migraine Pt is needing refill on her migraine medication.  Patient has taken both Zomig and Maxalt in the past, and feels as Maxalt is more effective for her.  Zomig 2.5 mg tablet. Pt is having migraines once every 3-6 months. Last migraine was a month ago.   She denies any changes to her headaches.  Denies any concerns about her blood pressure.     ROS: See pertinent positives and negatives per HPI.  Patient Active Problem List   Diagnosis Date Noted  . Osteopenia 05/22/2017  . Vitamin D deficiency 05/22/2017  . Migraine   . Raynaud's disease   . Fibrocystic breast disease     Social History   Tobacco Use  . Smoking status: Never Smoker  . Smokeless tobacco: Never Used  Substance Use Topics  . Alcohol use: Yes    Comment: occasional wine    Current Outpatient Medications:  .  cholecalciferol (VITAMIN D) 1000 units tablet, Take 1,000 Units by mouth daily., Disp: , Rfl:  .  FINACEA 15 % cream, APPLY GEL ONCE OR TWICE DAILY TO FACE., Disp: , Rfl: 2 .  Magnesium 500 MG CAPS, Take 500 mg by mouth daily., Disp: , Rfl:  .  Probiotic Product (VSL#3) CAPS, Take 1 capsule (112 billion) by mouth twice a day, Disp: 60 capsule, Rfl: 1 .  rizatriptan (MAXALT) 5 MG tablet, Take 1 tablet (5 mg total) by mouth as needed for migraine. May repeat in 2 hours if needed, Disp: 10 tablet, Rfl: 2  Allergies   Allergen Reactions  . Codeine Nausea And Vomiting    Objective:   VITALS: Per patient if applicable, see vitals. GENERAL: Alert, appears well and in no acute distress. HEENT: Atraumatic, conjunctiva clear, no obvious abnormalities on inspection of external nose and ears. NECK: Normal movements of the head and neck. CARDIOPULMONARY: No increased WOB. Speaking in clear sentences. I:E ratio WNL.  MS: Moves all visible extremities without noticeable abnormality. PSYCH: Pleasant and cooperative, well-groomed. Speech normal rate and rhythm. Affect is appropriate. Insight and judgement are appropriate. Attention is focused, linear, and appropriate.  NEURO: CN grossly intact. Oriented as arrived to appointment on time with no prompting. Moves both UE equally.  SKIN: No obvious lesions, wounds, erythema, or cyanosis noted on face or hands.  Assessment and Plan:   Deion was seen today for migraine and medication refill.  Diagnoses and all orders for this visit:  Migraine without status migrainosus, not intractable, unspecified migraine type  Other orders -     rizatriptan (MAXALT) 5 MG tablet; Take 1 tablet (5 mg total) by mouth as needed for migraine. May repeat in 2 hours if needed   Will refill her Maxalt today as this is working well for her.  Follow-up if symptoms persist despite treatment, or if any other concerns.  . Reviewed expectations re: course of current medical issues. . Discussed self-management of symptoms. . Outlined signs and symptoms indicating need for more  acute intervention. . Patient verbalized understanding and all questions were answered. Marland Kitchen Health Maintenance issues including appropriate healthy diet, exercise, and smoking avoidance were discussed with patient. . See orders for this visit as documented in the electronic medical record.  I discussed the assessment and treatment plan with the patient. The patient was provided an opportunity to ask questions and  all were answered. The patient agreed with the plan and demonstrated an understanding of the instructions.   The patient was advised to call back or seek an in-person evaluation if the symptoms worsen or if the condition fails to improve as anticipated.  CMA or LPN served as scribe during this visit. History, Physical, and Plan performed by medical provider. The above documentation has been reviewed and is accurate and complete.    [place], [place] 02/02/2019

## 2019-02-18 ENCOUNTER — Telehealth: Payer: Self-pay | Admitting: Nurse Practitioner

## 2019-02-18 NOTE — Telephone Encounter (Signed)
Patient would like to know what she will be taking since Probiotic Product (VSL#3) CAPS has been discontinued.

## 2019-02-22 NOTE — Telephone Encounter (Signed)
Please schedule televisit, next available. Thanks

## 2019-03-01 ENCOUNTER — Telehealth: Payer: Self-pay | Admitting: General Surgery

## 2019-03-01 NOTE — Telephone Encounter (Signed)
LVM for patient to call for pre-screen.

## 2019-03-02 ENCOUNTER — Encounter: Payer: Self-pay | Admitting: Gastroenterology

## 2019-03-02 ENCOUNTER — Other Ambulatory Visit: Payer: Self-pay

## 2019-03-02 ENCOUNTER — Ambulatory Visit (INDEPENDENT_AMBULATORY_CARE_PROVIDER_SITE_OTHER): Payer: BLUE CROSS/BLUE SHIELD | Admitting: Gastroenterology

## 2019-03-02 VITALS — Ht 64.0 in | Wt 103.0 lb

## 2019-03-02 DIAGNOSIS — K580 Irritable bowel syndrome with diarrhea: Secondary | ICD-10-CM | POA: Diagnosis not present

## 2019-03-02 NOTE — Progress Notes (Signed)
Shannon Foster    [id number]    [birth year]  Primary Care Physician:[place], [place], [place]  Referring Physician: Inda Coke, [address] [place], [place] [address]  This service was provided via audio and video telemedicine (Doximity) due to [place] 19 pandemic.  Patient location: Home Provider location: Office Used 2 patient identifiers to confirm the correct person. Explained the limitations in evaluation and management via telemedicine. Patient is aware of potential medical charges for this visit.  Patient consented to this virtual visit.  The persons participating in this telemedicine service were myself and the patient    Chief complaint: IBS-diarrhea  HPI:  58 year old female last seen in office November 07, 2017 with complaints of diarrhea after she took antibiotics.  ER visit October 30, 2017 for worsening diarrhea.  C. difficile and GI pathogen panel negative CT abdomen and pelvis October 31, 2017 showed diffuse colonic wall thickening and edema concerning for colitis, infectious versus inflammatory bowel disease  Colonoscopy August 27, 2013 by Dr. Collene Mares at [facility] Normal terminal ileum and normal appearing colon, small internal hemorrhoids otherwise normal exam  Diarrhea and symptoms of irritable bowel improved when she takes the probiotic VSL#3 but once she stops taking it for longer than 3 or 4 days her symptoms recur (diarrhea and nausea).  Denies any rectal bleeding, vomiting, abdominal pain, loss of appetite or weight loss.   Outpatient Encounter Medications as of 03/02/2019  Medication Sig  . cholecalciferol (VITAMIN D) 1000 units tablet Take 1,000 Units by mouth daily.  Marland Kitchen FINACEA 15 % cream APPLY GEL ONCE OR TWICE DAILY TO FACE.  . Magnesium 500 MG CAPS Take 500 mg by mouth daily.  . Probiotic Product (VSL#3) CAPS Take 1 capsule (112 billion) by mouth twice a day  . rizatriptan (MAXALT) 5 MG tablet Take 1 tablet (5  mg total) by mouth as needed for migraine. May repeat in 2 hours if needed   No facility-administered encounter medications on file as of 03/02/2019.     Allergies as of 03/02/2019 - Review Complete 02/02/2019  Allergen Reaction Noted  . Codeine Nausea And Vomiting 02/23/2013    Past Medical History:  Diagnosis Date  . Fibrocystic breast disease   . History of shingles 2014  . Migraine   . Raynaud's disease     Past Surgical History:  Procedure Laterality Date  . HYSTEROSCOPY  2001   BSO- endometriosis  . PELVIC LAPAROSCOPY  3x    endometriosis, LOA  . TOTAL ABDOMINAL HYSTERECTOMY      Family History  Problem Relation Age of Onset  . Parkinson's disease Father   . Hypertension Mother   . Dementia Mother   . Prostate cancer Brother   . Suicidality Sister     Social History   Socioeconomic History  . Marital status: Divorced    Spouse name: Not on file  . Number of children: 0  . Years of education: Not on file  . Highest education level: Not on file  Occupational History  . Not on file  Social Needs  . Financial resource strain: Not on file  . Food insecurity:    Worry: Not on file    Inability: Not on file  . Transportation needs:    Medical: Not on file    Non-medical: Not on file  Tobacco Use  . Smoking status: Never Smoker  . Smokeless tobacco: Never Used  Substance and Sexual Activity  .  Alcohol use: Yes    Comment: occasional wine  . Drug use: No  . Sexual activity: Yes    Partners: Male    Birth control/protection: Surgical    Comment: TAH/BSO  Lifestyle  . Physical activity:    Days per week: Not on file    Minutes per session: Not on file  . Stress: Not on file  Relationships  . Social connections:    Talks on phone: Not on file    Gets together: Not on file    Attends religious service: Not on file    Active member of club or organization: Not on file    Attends meetings of clubs or organizations: Not on file    Relationship status:  Not on file  . Intimate partner violence:    Fear of current or ex partner: Not on file    Emotionally abused: Not on file    Physically abused: Not on file    Forced sexual activity: Not on file  Other Topics Concern  . Not on file  Social History Narrative  . Not on file      Review of systems: Review of Systems as per HPI All other systems reviewed and are negative.   Physical Exam: Vitals were not taken and physical exam was not performed during this virtual visit.  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 Denzil Magnuson , MD   CC: Inda Coke, [place]

## 2019-03-02 NOTE — Patient Instructions (Addendum)
Try to taper VSL3 112 billion units to once every 3-7 days .   Start eating plain unflavoured yogurt  Recall colonoscopy January 2024  Follow-up office visit in 6 months  I appreciate the  opportunity to care for you  Thank You   Harl Bowie , MD

## 2019-03-04 ENCOUNTER — Encounter: Payer: Self-pay | Admitting: Gastroenterology

## 2019-06-23 DIAGNOSIS — H5319 Other subjective visual disturbances: Secondary | ICD-10-CM | POA: Diagnosis not present

## 2019-09-15 DIAGNOSIS — J029 Acute pharyngitis, unspecified: Secondary | ICD-10-CM | POA: Diagnosis not present

## 2019-10-04 ENCOUNTER — Ambulatory Visit (INDEPENDENT_AMBULATORY_CARE_PROVIDER_SITE_OTHER): Payer: BC Managed Care – PPO | Admitting: Physician Assistant

## 2019-10-04 ENCOUNTER — Encounter: Payer: Self-pay | Admitting: Physician Assistant

## 2019-10-04 VITALS — Temp 98.6°F | Ht 64.0 in | Wt 105.0 lb

## 2019-10-04 DIAGNOSIS — R05 Cough: Secondary | ICD-10-CM | POA: Diagnosis not present

## 2019-10-04 DIAGNOSIS — R300 Dysuria: Secondary | ICD-10-CM

## 2019-10-04 DIAGNOSIS — R059 Cough, unspecified: Secondary | ICD-10-CM

## 2019-10-04 LAB — POC URINALSYSI DIPSTICK (AUTOMATED)
Bilirubin, UA: NEGATIVE
Blood, UA: NEGATIVE
Glucose, UA: NEGATIVE
Ketones, UA: NEGATIVE
Nitrite, UA: NEGATIVE
Protein, UA: NEGATIVE
Spec Grav, UA: 1.01 (ref 1.010–1.025)
Urobilinogen, UA: 1 E.U./dL
pH, UA: 6.5 (ref 5.0–8.0)

## 2019-10-04 MED ORDER — CEPHALEXIN 500 MG PO CAPS: 500.0000 mg | ORAL_CAPSULE | Freq: Three times a day (TID) | ORAL | 0 refills | Status: AC

## 2019-10-04 NOTE — Addendum Note (Signed)
Addended by: Tomi Likens on: 10/04/2019 11:08 AM   Modules accepted: Orders

## 2019-10-04 NOTE — Progress Notes (Signed)
Virtual Visit via Video   I connected with Shannon Foster on 10/04/19 at  1:00 PM EST by a video enabled telemedicine application and verified that I am speaking with the correct person using two identifiers. Location patient: Home Location provider: [facility], Office Persons participating in the virtual visit: [place], [place] PA-C, Anselmo Pickler, LPN   I discussed the limitations of evaluation and management by telemedicine and the availability of in person appointments. The patient expressed understanding and agreed to proceed.  I acted as a Education administrator for [employer], [employer], LPN  Subjective:   HPI:  Dysuria Pt c/o burning and pain with urination and frequency since last night. Having some low back pain. Hx UTI's.  She does endorse that she has not been drinking as much fluids as she normally would on a daily basis.  Denies: Fever, chills, significant back pain, hematuria, malaise.  Cough Pt c/o cough and congestion, expectorating clear sputum. Denies fever, chills or headaches. Pt is taking Advil cold & sinus, Vicks vapor rub. Pt had COVID testing done 2 weeks ago and was Negative.  ROS: See pertinent positives and negatives per HPI.  Patient Active Problem List   Diagnosis Date Noted  . Osteopenia 05/22/2017  . Vitamin D deficiency 05/22/2017  . Migraine   . Raynaud's disease   . Fibrocystic breast disease     Social History   Tobacco Use  . Smoking status: Never Smoker  . Smokeless tobacco: Never Used  Substance Use Topics  . Alcohol use: Yes    Comment: occasional wine    Current Outpatient Medications:  .  cholecalciferol (VITAMIN D) 1000 units tablet, Take 1,000 Units by mouth daily., Disp: , Rfl:  .  FINACEA 15 % cream, APPLY GEL ONCE OR TWICE DAILY TO FACE., Disp: , Rfl: 2 .  Magnesium 500 MG CAPS, Take 500 mg by mouth daily., Disp: , Rfl:  .  Probiotic Product (VSL#3) CAPS, Take 1 capsule (112 billion) by  mouth twice a day, Disp: 60 capsule, Rfl: 1 .  rizatriptan (MAXALT) 5 MG tablet, Take 1 tablet (5 mg total) by mouth as needed for migraine. May repeat in 2 hours if needed, Disp: 10 tablet, Rfl: 2 .  cephALEXin (KEFLEX) 500 MG capsule, Take 1 capsule (500 mg total) by mouth 3 (three) times daily for 5 days., Disp: 15 capsule, Rfl: 0  Allergies  Allergen Reactions  . Codeine Nausea And Vomiting    Objective:   VITALS: Per patient if applicable, see vitals. GENERAL: Alert, appears well and in no acute distress. HEENT: Atraumatic, conjunctiva clear, no obvious abnormalities on inspection of external nose and ears. NECK: Normal movements of the head and neck. CARDIOPULMONARY: No increased WOB. Speaking in clear sentences. I:E ratio WNL.  MS: Moves all visible extremities without noticeable abnormality. PSYCH: Pleasant and cooperative, well-groomed. Speech normal rate and rhythm. Affect is appropriate. Insight and judgement are appropriate. Attention is focused, linear, and appropriate.  NEURO: CN grossly intact. Oriented as arrived to appointment on time with no prompting. Moves both UE equally.  SKIN: No obvious lesions, wounds, erythema, or cyanosis noted on face or hands.    Assessment and Plan:   Meg[place] was seen today for dysuria, urinary frequency and cough.  Diagnoses and all orders for this visit:  Dysuria No red flags on discussion.  Encouraged more hydration.  She is going to bring by a urine sample later today for [place] to evaluate and send  off for culture.  We will go ahead and empirically treat with Keflex per orders.  Worsening precautions advised.  If no improvement within 1 to 2 days patient was recommended to follow-up.  Patient verbalized understanding to plan.  Will adjust treatment based on culture results if needed. -     POCT Urinalysis Dipstick (Automated) -     Urine Culture  Cough No red flags on discussion.  Recommended that she start Mucinex with  dextromethorphan to help with her symptoms.  Discussed taking medications as prescribed. Reviewed return precautions including worsening fever, SOB, worsening cough or other concerns. Push fluids and rest. I recommend that patient follow-up if symptoms worsen or persist despite treatment x 7-10 days, sooner if needed.  Other orders -     cephALEXin (KEFLEX) 500 MG capsule; Take 1 capsule (500 mg total) by mouth 3 (three) times daily for 5 days.  . Reviewed expectations re: course of current medical issues. . Discussed self-management of symptoms. . Outlined signs and symptoms indicating need for more acute intervention. . Patient verbalized understanding and all questions were answered. Marland Kitchen Health Maintenance issues including appropriate healthy diet, exercise, and smoking avoidance were discussed with patient. . See orders for this visit as documented in the electronic medical record.  I discussed the assessment and treatment plan with the patient. The patient was provided an opportunity to ask questions and all were answered. The patient agreed with the plan and demonstrated an understanding of the instructions.   The patient was advised to call back or seek an in-person evaluation if the symptoms worsen or if the condition fails to improve as anticipated.   CMA or LPN served as scribe during this visit. History, Physical, and Plan performed by medical provider. The above documentation has been reviewed and is accurate and complete.   [place], [place] 10/04/2019

## 2019-10-05 LAB — URINE CULTURE
MICRO NUMBER:: 1170780
SPECIMEN QUALITY:: ADEQUATE

## 2019-10-08 ENCOUNTER — Telehealth: Payer: Self-pay | Admitting: Physician Assistant

## 2019-10-08 NOTE — Telephone Encounter (Signed)
Left message on voicemail to call office. Please tell pt results and message was sent to My Chart she has not read it yet.

## 2019-10-08 NOTE — Telephone Encounter (Signed)
See note  Copied from [place] [phone]. Topic: General - Inquiry >> Oct 08, 2019  1:46 PM Reyne Dumas L wrote: Reason for CRM:  Pt called to get results of uranalysis she dropped off.

## 2019-10-11 ENCOUNTER — Encounter: Payer: Self-pay | Admitting: Physician Assistant

## 2019-10-11 ENCOUNTER — Telehealth: Payer: Self-pay | Admitting: Physician Assistant

## 2019-10-11 ENCOUNTER — Ambulatory Visit: Payer: BC Managed Care – PPO | Admitting: Physician Assistant

## 2019-10-11 DIAGNOSIS — R399 Unspecified symptoms and signs involving the genitourinary system: Secondary | ICD-10-CM

## 2019-10-11 NOTE — Progress Notes (Signed)
Pt is still having urinary symptoms. She is c/o low back pain, pelvic pressure and urgency with urination. Pt denies frequency, or dysuria, no fever or chills, no hematuria noted.  Virtual visit scheduled for patient today at 3:40p.  I briefly connected with patient via telemedicine and re-scheduled her for an in-office appointment tomorrow so more appropriate evaluation could be performed.  Worsening precautions advised in the interim.  No charge for today's visit.

## 2019-10-11 NOTE — Telephone Encounter (Signed)
See note  Copied from [place] [phone]. Topic: General - Call Back - No Documentation >> Oct 11, 2019 12:25 PM Erick Blinks wrote: Reason for CRM: Pt returned phone call from Butch Penny, please advise  [phone]

## 2019-10-11 NOTE — Telephone Encounter (Signed)
Already spoke to pt called to see if she could come in to office for appt but could not. Will keep virtual.

## 2019-10-12 ENCOUNTER — Ambulatory Visit (INDEPENDENT_AMBULATORY_CARE_PROVIDER_SITE_OTHER): Payer: BC Managed Care – PPO | Admitting: Physician Assistant

## 2019-10-12 ENCOUNTER — Other Ambulatory Visit: Payer: Self-pay

## 2019-10-12 ENCOUNTER — Encounter: Payer: Self-pay | Admitting: Physician Assistant

## 2019-10-12 ENCOUNTER — Other Ambulatory Visit (HOSPITAL_COMMUNITY)
Admission: RE | Admit: 2019-10-12 | Discharge: 2019-10-12 | Disposition: A | Discharge status: Home / Self Care | Payer: BC Managed Care – PPO | Source: Ambulatory Visit | Attending: Physician Assistant | Admitting: Physician Assistant

## 2019-10-12 VITALS — BP 140/86 | HR 96 | Temp 97.7°F | Ht 64.0 in | Wt 110.0 lb

## 2019-10-12 DIAGNOSIS — R102 Pelvic and perineal pain: Secondary | ICD-10-CM

## 2019-10-12 DIAGNOSIS — R3915 Urgency of urination: Secondary | ICD-10-CM

## 2019-10-12 DIAGNOSIS — M545 Low back pain, unspecified: Secondary | ICD-10-CM

## 2019-10-12 LAB — POCT URINALYSIS DIPSTICK
Bilirubin, UA: NEGATIVE
Blood, UA: NEGATIVE
Glucose, UA: NEGATIVE
Ketones, UA: NEGATIVE
Nitrite, UA: NEGATIVE
Protein, UA: NEGATIVE
Spec Grav, UA: 1.015 (ref 1.010–1.025)
Urobilinogen, UA: 0.2 E.U./dL
pH, UA: 6 (ref 5.0–8.0)

## 2019-10-12 LAB — CBC WITH DIFFERENTIAL/PLATELET
Basophils Absolute: 0 10*3/uL (ref 0.0–0.1)
Basophils Relative: 0.6 % (ref 0.0–3.0)
Eosinophils Absolute: 0.1 10*3/uL (ref 0.0–0.7)
Eosinophils Relative: 2.1 % (ref 0.0–5.0)
HCT: 40.4 % (ref 36.0–46.0)
Hemoglobin: 13.8 g/dL (ref 12.0–15.0)
Lymphocytes Relative: 25.8 % (ref 12.0–46.0)
Lymphs Abs: 1.2 10*3/uL (ref 0.7–4.0)
MCHC: 34.2 g/dL (ref 30.0–36.0)
MCV: 91 fl (ref 78.0–100.0)
Monocytes Absolute: 0.3 10*3/uL (ref 0.1–1.0)
Monocytes Relative: 6.7 % (ref 3.0–12.0)
Neutro Abs: 3 10*3/uL (ref 1.4–7.7)
Neutrophils Relative %: 64.8 % (ref 43.0–77.0)
Platelets: 227 10*3/uL (ref 150.0–400.0)
RBC: 4.43 Mil/uL (ref 3.87–5.11)
RDW: 12.5 % (ref 11.5–15.5)
WBC: 4.6 10*3/uL (ref 4.0–10.5)

## 2019-10-12 LAB — COMPREHENSIVE METABOLIC PANEL
ALT: 17 U/L (ref 0–35)
AST: 21 U/L (ref 0–37)
Albumin: 4.1 g/dL (ref 3.5–5.2)
Alkaline Phosphatase: 72 U/L (ref 39–117)
BUN: 19 mg/dL (ref 6–23)
CO2: 30 mEq/L (ref 19–32)
Calcium: 8.8 mg/dL (ref 8.4–10.5)
Chloride: 106 mEq/L (ref 96–112)
Creatinine, Ser: 0.68 mg/dL (ref 0.40–1.20)
GFR: 88.08 mL/min (ref >60.00)
Glucose, Bld: 70 mg/dL (ref 70–99)
Potassium: 3.6 mEq/L (ref 3.5–5.1)
Sodium: 141 mEq/L (ref 135–145)
Total Bilirubin: 0.3 mg/dL (ref 0.2–1.2)
Total Protein: 6.2 g/dL (ref 6.0–8.3)

## 2019-10-12 NOTE — Patient Instructions (Addendum)
It was great to see you!  I will be in touch with your labs, swab and urine culture results.  Take care,  Inda Coke PA-C   General instructions  Make sure you: ? Pee until your bladder is empty. ? Do not hold pee for a long time. ? Empty your bladder after sex. ? Wipe from front to back after pooping if you are a female. Use each tissue one time when you wipe.  Drink enough fluid to keep your pee pale yellow.  Keep all follow-up visits as told by your doctor. This is important. Contact a doctor if:  You do not get better after 1-2 days.  Your symptoms go away and then come back. Get help right away if:  You have very bad back pain.  You have very bad pain in your lower belly.  You have a fever.  You are sick to your stomach (nauseous).  You are throwing up.

## 2019-10-12 NOTE — Progress Notes (Signed)
Shannon Foster is a 58 y.o. female here for a follow up of a pre-existing problem.  I acted as a Education administrator for [employer], PA-C Anselmo Pickler, LPN  History of Present Illness:   Chief Complaint  Patient presents with  . Back Pain  . Pelvic Pain  . Urinary Urgency    HPI   Seen by me on 10/04/19 for dysuria: Pt c/o burning and pain with urination and frequency since last night. Having some low back pain. Hx UTI's.  She does endorse that she has not been drinking as much fluids as she normally would on a daily basis.  Denies: Fever, chills, significant back pain, hematuria, malaise. POC UA showed small lueks. She was prescribed Keflex 500 mg TID and she took as prescribed. Urine culture was negative.  Today: Continues to have symptoms -- specifically has low back pain, pelvic pressure and urgency with urination. Does not have burning or dysuria this time. Denies fevers, chills, hematuria. She is sexually active, and her last sexual encounter was painful. Denies any concerns for constipation. She has been pushing fluids and feels well hydrated.   Past Medical History:  Diagnosis Date  . Fibrocystic breast disease   . History of shingles 2014  . Migraine   . Raynaud's disease      Social History   Socioeconomic History  . Marital status: Divorced    Spouse name: Not on file  . Number of children: 0  . Years of education: Not on file  . Highest education level: Not on file  Occupational History  . Not on file  Tobacco Use  . Smoking status: Never Smoker  . Smokeless tobacco: Never Used  Substance and Sexual Activity  . Alcohol use: Yes    Comment: occasional wine  . Drug use: No  . Sexual activity: Yes    Partners: Male    Birth control/protection: Surgical    Comment: TAH/BSO  Other Topics Concern  . Not on file  Social History Narrative  . Not on file   Social Determinants of Health   Financial Resource Strain:   . Difficulty of Paying Living  Expenses: Not on file  Food Insecurity:   . Worried About Charity fundraiser in the Last Year: Not on file  . Ran Out of Food in the Last Year: Not on file  Transportation Needs:   . Lack of Transportation (Medical): Not on file  . Lack of Transportation (Non-Medical): Not on file  Physical Activity:   . Days of Exercise per Week: Not on file  . Minutes of Exercise per Session: Not on file  Stress:   . Feeling of Stress : Not on file  Social Connections:   . Frequency of Communication with Friends and Family: Not on file  . Frequency of Social Gatherings with Friends and Family: Not on file  . Attends Religious Services: Not on file  . Active Member of Clubs or Organizations: Not on file  . Attends Archivist Meetings: Not on file  . Marital Status: Not on file  Intimate Partner Violence:   . Fear of Current or Ex-Partner: Not on file  . Emotionally Abused: Not on file  . Physically Abused: Not on file  . Sexually Abused: Not on file    Past Surgical History:  Procedure Laterality Date  . HYSTEROSCOPY  2001   BSO- endometriosis  . PELVIC LAPAROSCOPY  3x    endometriosis, LOA  . TOTAL ABDOMINAL HYSTERECTOMY  Family History  Problem Relation Age of Onset  . Parkinson's disease Father   . Hypertension Mother   . Dementia Mother   . Prostate cancer Brother   . Suicidality Sister     Allergies  Allergen Reactions  . Codeine Nausea And Vomiting    Current Medications:   Current Outpatient Medications:  .  cholecalciferol (VITAMIN D) 1000 units tablet, Take 1,000 Units by mouth daily., Disp: , Rfl:  .  FINACEA 15 % cream, APPLY GEL ONCE OR TWICE DAILY TO FACE., Disp: , Rfl: 2 .  Magnesium 500 MG CAPS, Take 500 mg by mouth daily., Disp: , Rfl:  .  Probiotic Product (VSL#3) CAPS, Take 1 capsule (112 billion) by mouth twice a day, Disp: 60 capsule, Rfl: 1 .  rizatriptan (MAXALT) 5 MG tablet, Take 1 tablet (5 mg total) by mouth as needed for migraine. May  repeat in 2 hours if needed, Disp: 10 tablet, Rfl: 2   Review of Systems:   ROS  Negative unless otherwise specified per HPI.  Vitals:   Vitals:   10/12/19 1307  BP: 140/86  Pulse: 96  Temp: 97.7 F (36.5 C)  TempSrc: Temporal  Weight: 110 lb (49.9 kg)  Height: 5\' 4"  (1.626 m)     Body mass index is 18.88 kg/m.  Physical Exam:   Physical Exam Vitals and nursing note reviewed. Exam conducted with a chaperone present Butch Penny, LPN).  Constitutional:      General: She is not in acute distress.    Appearance: She is well-developed. She is not ill-appearing or toxic-appearing.  Cardiovascular:     Rate and Rhythm: Normal rate and regular rhythm.     Pulses: Normal pulses.     Heart sounds: Normal heart sounds, S1 normal and S2 normal.     Comments: No LE edema Pulmonary:     Effort: Pulmonary effort is normal.     Breath sounds: Normal breath sounds.  Abdominal:     General: Abdomen is flat. Bowel sounds are normal.     Palpations: Abdomen is soft.     Tenderness: There is abdominal tenderness in the suprapubic area. There is no right CVA tenderness or left CVA tenderness.  Genitourinary:    Labia:        Right: No rash or tenderness.        Left: No rash.      Vagina: Vaginal discharge (white, thin) present. No erythema.     Cervix: Normal.  Skin:    General: Skin is warm and dry.  Neurological:     Mental Status: She is alert.     GCS: GCS eye subscore is 4. GCS verbal subscore is 5. GCS motor subscore is 6.  Psychiatric:        Speech: Speech normal.        Behavior: Behavior normal. Behavior is cooperative.     Results for orders placed or performed in visit on 10/12/19  POCT urinalysis dipstick  Result Value Ref Range   Color, UA light yellow    Clarity, UA clear    Glucose, UA Negative Negative   Bilirubin, UA Negative    Ketones, UA Negative    Spec Grav, UA 1.015 1.010 - 1.025   Blood, UA Negative    pH, UA 6.0 5.0 - 8.0   Protein, UA Negative  Negative   Urobilinogen, UA 0.2 0.2 or 1.0 E.U./dL   Nitrite, UA Negative    Leukocytes, UA Trace (A) Negative  Appearance     Odor      Assessment and Plan:   Mendie was seen today for back pain, pelvic pain and urinary urgency.  Diagnoses and all orders for this visit:  Pelvic pain; Urinary urgency; Bilateral low back pain without sciatica, unspecified chronicity No red flags on exam. Her urine has slight leuks, I don't feel strongly about treating with oral antibiotic at this time. Will await urine culture, vaginal swabs and lab results for further intervention. If all results are normal, I did discuss sending to urology possibly. -     POCT urinalysis dipstick -     Urine culture -     Cervicovaginal ancillary only -     Comprehensive metabolic panel  . Reviewed expectations re: course of current medical issues. . Discussed self-management of symptoms. . Outlined signs and symptoms indicating need for more acute intervention. . Patient verbalized understanding and all questions were answered. . See orders for this visit as documented in the electronic medical record. . Patient received an After-Visit Summary.  CMA or LPN served as scribe during this visit. History, Physical, and Plan performed by medical provider. The above documentation has been reviewed and is accurate and complete.  Inda Coke, PA-C

## 2019-10-13 LAB — CERVICOVAGINAL ANCILLARY ONLY
Bacterial Vaginitis (gardnerella): NEGATIVE
Candida Glabrata: NEGATIVE
Candida Vaginitis: NEGATIVE
Comment: NEGATIVE
Comment: NEGATIVE
Comment: NEGATIVE

## 2019-10-14 LAB — URINE CULTURE
MICRO NUMBER:: 1199445
SPECIMEN QUALITY:: ADEQUATE

## 2019-10-20 ENCOUNTER — Other Ambulatory Visit: Payer: Self-pay | Admitting: Physician Assistant

## 2019-10-20 ENCOUNTER — Other Ambulatory Visit: Payer: Self-pay

## 2019-10-20 ENCOUNTER — Telehealth: Payer: Self-pay | Admitting: Physician Assistant

## 2019-10-20 ENCOUNTER — Other Ambulatory Visit (INDEPENDENT_AMBULATORY_CARE_PROVIDER_SITE_OTHER): Payer: BC Managed Care – PPO

## 2019-10-20 DIAGNOSIS — R102 Pelvic and perineal pain: Secondary | ICD-10-CM

## 2019-10-20 DIAGNOSIS — R3915 Urgency of urination: Secondary | ICD-10-CM

## 2019-10-20 DIAGNOSIS — M545 Low back pain, unspecified: Secondary | ICD-10-CM

## 2019-10-20 DIAGNOSIS — R300 Dysuria: Secondary | ICD-10-CM | POA: Diagnosis not present

## 2019-10-20 LAB — POC URINALSYSI DIPSTICK (AUTOMATED)
Bilirubin, UA: POSITIVE
Blood, UA: NEGATIVE
Glucose, UA: NEGATIVE
Ketones, UA: NEGATIVE
Nitrite, UA: POSITIVE
Protein, UA: NEGATIVE
Spec Grav, UA: 1.015 (ref 1.010–1.025)
Urobilinogen, UA: 1 E.U./dL
pH, UA: 6.5 (ref 5.0–8.0)

## 2019-10-20 MED ORDER — NITROFURANTOIN MONOHYD MACRO 100 MG PO CAPS: 100.0000 mg | ORAL_CAPSULE | Freq: Two times a day (BID) | ORAL | 0 refills | Status: DC

## 2019-10-20 NOTE — Telephone Encounter (Signed)
Copied from [place] [phone]. Topic: General - Other >> Oct 20, 2019  2:23 PM Keene Breath wrote: Reason for CRM: Patient called to say that the pain and bladder issue she is having has gotten worse.  CB# [phone]

## 2019-10-20 NOTE — Telephone Encounter (Signed)
Called patient will come by and drop of urine today. Orders have been placed.

## 2019-10-20 NOTE — Telephone Encounter (Signed)
See note

## 2019-10-20 NOTE — Telephone Encounter (Signed)
Continue to push fluids. Would she be able to drop off a urine today (put on my schedule) or tomorrow (another provider), just to make sure there are no signs of infection?

## 2019-10-20 NOTE — Telephone Encounter (Signed)
Called patient she would like referral per last lab note. I have stared.  Patient wanted to know if their anything that she needs to be doing over the weekend. She is having discomfort with urination and back pain. She has increased fluids.

## 2019-10-21 LAB — URINE CULTURE
MICRO NUMBER:: 1227047
SPECIMEN QUALITY:: ADEQUATE

## 2019-12-08 DIAGNOSIS — T485X5A Adverse effect of other anti-common-cold drugs, initial encounter: Secondary | ICD-10-CM | POA: Diagnosis not present

## 2019-12-08 DIAGNOSIS — J310 Chronic rhinitis: Secondary | ICD-10-CM | POA: Diagnosis not present

## 2019-12-08 DIAGNOSIS — J343 Hypertrophy of nasal turbinates: Secondary | ICD-10-CM | POA: Diagnosis not present

## 2020-03-10 ENCOUNTER — Other Ambulatory Visit: Payer: Self-pay

## 2020-03-10 ENCOUNTER — Telehealth: Payer: Self-pay | Admitting: Physician Assistant

## 2020-03-10 DIAGNOSIS — I7300 Raynaud's syndrome without gangrene: Secondary | ICD-10-CM

## 2020-03-10 NOTE — Telephone Encounter (Signed)
Referral placed. LVM to notify patient.

## 2020-03-10 NOTE — Telephone Encounter (Signed)
Ok to place rheumatology referral for raynauds.

## 2020-03-10 NOTE — Telephone Encounter (Signed)
Please advise 

## 2020-03-10 NOTE — Telephone Encounter (Signed)
Patient calling stating that she needs a referral per her job on record for her having a rheumatologist in which she needs. Please Advise

## 2020-03-23 DIAGNOSIS — L300 Nummular dermatitis: Secondary | ICD-10-CM | POA: Diagnosis not present

## 2020-03-23 DIAGNOSIS — L814 Other melanin hyperpigmentation: Secondary | ICD-10-CM | POA: Diagnosis not present

## 2020-03-23 DIAGNOSIS — L821 Other seborrheic keratosis: Secondary | ICD-10-CM | POA: Diagnosis not present

## 2020-03-23 DIAGNOSIS — L820 Inflamed seborrheic keratosis: Secondary | ICD-10-CM | POA: Diagnosis not present

## 2020-03-23 DIAGNOSIS — D225 Melanocytic nevi of trunk: Secondary | ICD-10-CM | POA: Diagnosis not present

## 2020-03-23 DIAGNOSIS — L570 Actinic keratosis: Secondary | ICD-10-CM | POA: Diagnosis not present

## 2020-05-05 ENCOUNTER — Telehealth: Payer: Self-pay

## 2020-05-05 NOTE — Telephone Encounter (Signed)
Patient wants it noted in her chart that she had the pfizer vaccine on 05/04/20 and had an adverse reaction. She experienced tachycardia, palpitations, low bp, low 02 saturation, hives, and a fever of 102. She states all have returned to normal, with an occasional palpitation. She states that she will NOT be receiving the second dose of the covid pfizer vaccine.

## 2020-05-08 NOTE — Telephone Encounter (Signed)
Noted  

## 2020-05-12 ENCOUNTER — Encounter: Payer: Self-pay | Admitting: Physician Assistant

## 2020-05-12 ENCOUNTER — Telehealth (INDEPENDENT_AMBULATORY_CARE_PROVIDER_SITE_OTHER): Payer: BC Managed Care – PPO | Admitting: Physician Assistant

## 2020-05-12 VITALS — Ht 64.0 in | Wt 106.0 lb

## 2020-05-12 DIAGNOSIS — T50Z95A Adverse effect of other vaccines and biological substances, initial encounter: Secondary | ICD-10-CM | POA: Diagnosis not present

## 2020-05-12 DIAGNOSIS — R0602 Shortness of breath: Secondary | ICD-10-CM | POA: Diagnosis not present

## 2020-05-12 NOTE — Progress Notes (Signed)
Virtual Visit via Video   I connected with Shannon Foster on 05/12/20 at  3:30 PM EDT by a video enabled telemedicine application and verified that I am speaking with the correct person using two identifiers. Location patient: Home Location provider: [facility], Office Persons participating in the virtual visit: Toyia, Jelinek PA-C, Anselmo Pickler, LPN   I discussed the limitations of evaluation and management by telemedicine and the availability of in person appointments. The patient expressed understanding and agreed to proceed.  I acted as a Education administrator for [employer], PA-C [employer], LPN   Subjective:   HPI:   Allergic reaction Pt c/o allergic reaction to COVID vaccine which she received last Wed 07/07. Pt had tachycardia 160 x 3 days, Fever 102, hives on upper thighs which is subsiding. Pt c/o SOB and dizziness off and on. Heart rate still in the 100-110 and still having fevers off and on. Pt took Advil yesterday for a headache.  Has SOB that is getting worse with time.   ROS: See pertinent positives and negatives per HPI.  Patient Active Problem List   Diagnosis Date Noted  . Osteopenia 05/22/2017  . Vitamin D deficiency 05/22/2017  . Migraine   . Raynaud's disease   . Fibrocystic breast disease     Social History   Tobacco Use  . Smoking status: Never Smoker  . Smokeless tobacco: Never Used  Substance Use Topics  . Alcohol use: Yes    Comment: occasional wine    Current Outpatient Medications:  .  cholecalciferol (VITAMIN D) 1000 units tablet, Take 1,000 Units by mouth daily., Disp: , Rfl:  .  FINACEA 15 % cream, APPLY GEL ONCE OR TWICE DAILY TO FACE., Disp: , Rfl: 2 .  fluticasone (FLONASE) 50 MCG/ACT nasal spray, 2 sprays by Each Nare route 2 times daily., Disp: , Rfl:  .  Magnesium 500 MG CAPS, Take 500 mg by mouth daily., Disp: , Rfl:  .  Probiotic Product (VSL#3) CAPS, Take 1 capsule (112 billion) by mouth twice a  day, Disp: 60 capsule, Rfl: 1 .  rizatriptan (MAXALT) 5 MG tablet, Take 1 tablet (5 mg total) by mouth as needed for migraine. May repeat in 2 hours if needed, Disp: 10 tablet, Rfl: 2  Allergies  Allergen Reactions  . Codeine Nausea And Vomiting    Objective:   VITALS: Per patient if applicable, see vitals. GENERAL: Alert, appears well and in no acute distress. HEENT: Atraumatic, conjunctiva clear, no obvious abnormalities on inspection of external nose and ears. NECK: Normal movements of the head and neck. CARDIOPULMONARY: No increased WOB. Speaking in clear sentences. I:E ratio WNL.  MS: Moves all visible extremities without noticeable abnormality. PSYCH: Pleasant and cooperative, well-groomed. Speech normal rate and rhythm. Affect is appropriate. Insight and judgement are appropriate. Attention is focused, linear, and appropriate.  NEURO: CN grossly intact. Oriented as arrived to appointment on time with no prompting. Moves both UE equally.  SKIN: No obvious lesions, wounds, erythema, or cyanosis noted on face or hands.  Assessment and Plan:   Keeghan was seen today for allergic reaction.  Diagnoses and all orders for this visit:  SOB (shortness of breath)  Vaccine reaction, initial encounter   Given ongoing symptoms are worsening in severity, recommended patient to go ER vs Urgent Care for further evaluation and management to rule out cardiac, pulmonary or other significant health concerns.   . Reviewed expectations re: course of current medical issues. . Discussed  self-management of symptoms. . Outlined signs and symptoms indicating need for more acute intervention. . Patient verbalized understanding and all questions were answered. Marland Kitchen Health Maintenance issues including appropriate healthy diet, exercise, and smoking avoidance were discussed with patient. . See orders for this visit as documented in the electronic medical record.  I discussed the assessment and treatment  plan with the patient. The patient was provided an opportunity to ask questions and all were answered. The patient agreed with the plan and demonstrated an understanding of the instructions.   The patient was advised to call back or seek an in-person evaluation if the symptoms worsen or if the condition fails to improve as anticipated.   CMA or LPN served as scribe during this visit. History, Physical, and Plan performed by medical provider. The above documentation has been reviewed and is accurate and complete.   [place], [place] 05/12/2020

## 2020-06-05 ENCOUNTER — Other Ambulatory Visit: Payer: Self-pay | Admitting: Physician Assistant

## 2020-06-06 ENCOUNTER — Telehealth: Payer: Self-pay | Admitting: Physician Assistant

## 2020-06-06 NOTE — Telephone Encounter (Signed)
Pt called following up on migraine medication prescription.

## 2020-06-06 NOTE — Telephone Encounter (Signed)
Pt was calling about her medication. Rizatriptan was already sent to pharmacy.

## 2020-06-07 ENCOUNTER — Ambulatory Visit: Payer: BC Managed Care – PPO | Admitting: Physician Assistant

## 2020-06-07 ENCOUNTER — Other Ambulatory Visit: Payer: Self-pay

## 2020-06-07 ENCOUNTER — Encounter: Payer: Self-pay | Admitting: Physician Assistant

## 2020-06-07 VITALS — BP 112/80 | HR 90 | Ht 64.0 in | Wt 107.4 lb

## 2020-06-07 DIAGNOSIS — R252 Cramp and spasm: Secondary | ICD-10-CM

## 2020-06-07 DIAGNOSIS — T50Z95D Adverse effect of other vaccines and biological substances, subsequent encounter: Secondary | ICD-10-CM | POA: Diagnosis not present

## 2020-06-07 NOTE — Patient Instructions (Signed)
It was great to see you!  Please work on continued adequate hydration and electrolyte repletion (bananas, gatorade, etc.)  If cramping persists after a week, let me know.  Take care,  Inda Coke PA-C

## 2020-06-07 NOTE — Progress Notes (Signed)
Shannon Foster is a 59 y.o. female is here to discuss: COVID19 results and need form completed for [facility]  I acted as a Education administrator for [employer], PA-C Serita Sheller, [place]  History of Present Illness:   Chief Complaint  Patient presents with  . [phone] vaccine consult    HPI  COVID vaccination Patient reports that she is hoping to get exempted for her COVID-19 second [place] vaccination. She had her first vaccine on 7/7 and developed significant side effects, that were discussed in a subsequent visit with me. These included: fever, hives, tachycardia, SOB, chest pain.  She has ongoing shortness of breath, fatigue, cramping all over.   Health Maintenance Due  Topic Date Due  . Hepatitis C Screening  Never done  . HIV Screening  Never done  . TETANUS/TDAP  Never done  . PAP SMEAR-Modifier  Never done  . COVID-19 Vaccine (2 - Pfizer 2-dose series) 04/24/2020  . INFLUENZA VACCINE  05/28/2020    Past Medical History:  Diagnosis Date  . Fibrocystic breast disease   . History of shingles 2014  . Migraine   . Raynaud's disease      Social History   Tobacco Use  . Smoking status: Never Smoker  . Smokeless tobacco: Never Used  Vaping Use  . Vaping Use: Never used  Substance Use Topics  . Alcohol use: Yes    Comment: occasional wine  . Drug use: No    Past Surgical History:  Procedure Laterality Date  . HYSTEROSCOPY  2001   BSO- endometriosis  . PELVIC LAPAROSCOPY  3x    endometriosis, LOA  . TOTAL ABDOMINAL HYSTERECTOMY      Family History  Problem Relation Age of Onset  . Parkinson's disease Father   . Hypertension Mother   . Dementia Mother   . Prostate cancer Brother   . Suicidality Sister     PMHx, SurgHx, SocialHx, FamHx, Medications, and Allergies were reviewed in the Visit Navigator and updated as appropriate.   Patient Active Problem List   Diagnosis Date Noted  . Osteopenia 05/22/2017  . Vitamin D deficiency 05/22/2017  .  Migraine   . Raynaud's disease   . Fibrocystic breast disease     Social History   Tobacco Use  . Smoking status: Never Smoker  . Smokeless tobacco: Never Used  Vaping Use  . Vaping Use: Never used  Substance Use Topics  . Alcohol use: Yes    Comment: occasional wine  . Drug use: No    Current Medications and Allergies:    Current Outpatient Medications:  .  cholecalciferol (VITAMIN D) 1000 units tablet, Take 1,000 Units by mouth daily., Disp: , Rfl:  .  FINACEA 15 % cream, APPLY GEL ONCE OR TWICE DAILY TO FACE., Disp: , Rfl: 2 .  fluticasone (FLONASE) 50 MCG/ACT nasal spray, 2 sprays by Each Nare route 2 times daily., Disp: , Rfl:  .  Magnesium 500 MG CAPS, Take 500 mg by mouth daily., Disp: , Rfl:  .  Probiotic Product (VSL#3) CAPS, Take 1 capsule (112 billion) by mouth twice a day, Disp: 60 capsule, Rfl: 1 .  rizatriptan (MAXALT) 5 MG tablet, TAKE 1 TABLET BY MOUTH AS NEEDED FOR MIGRAINE. MAY REPEAT IN 2 HOURS IF NEEDED, Disp: 10 tablet, Rfl: 2   Allergies  Allergen Reactions  . Codeine Nausea And Vomiting    Review of Systems   ROS  Negative unless otherwise specified per HPI.  Vitals:   Vitals:  06/07/20 0827  BP: 112/80  Pulse: 90  SpO2: 98%  Weight: 107 lb 6.4 oz (48.7 kg)  Height: 5\' 4"  (1.626 m)     Body mass index is 18.44 kg/m.   Physical Exam:    Physical Exam Vitals and nursing note reviewed.  Constitutional:      General: She is not in acute distress.    Appearance: She is well-developed. She is not ill-appearing or toxic-appearing.  Cardiovascular:     Rate and Rhythm: Normal rate and regular rhythm.     Pulses: Normal pulses.     Heart sounds: Normal heart sounds, S1 normal and S2 normal.     Comments: No LE edema Pulmonary:     Effort: Pulmonary effort is normal.     Breath sounds: Normal breath sounds.  Skin:    General: Skin is warm and dry.  Neurological:     Mental Status: She is alert.     GCS: GCS eye subscore is 4. GCS  verbal subscore is 5. GCS motor subscore is 6.  Psychiatric:        Speech: Speech normal.        Behavior: Behavior normal. Behavior is cooperative.      Assessment and Plan:    Shannon Foster was seen today for covid19 vaccine consult.  Diagnoses and all orders for this visit:  Adverse effect of vaccine, subsequent encounter Exemption form completed.  Cramps, muscle, general Declined further work-up today. Encouraged hydration and adequate nutrition. She reports that she will follow-up in 1 week if she has any ongoing concerns.  . Reviewed expectations re: course of current medical issues. . Discussed self-management of symptoms. . Outlined signs and symptoms indicating need for more acute intervention. . Patient verbalized understanding and all questions were answered. . See orders for this visit as documented in the electronic medical record. . Patient received an After Visit Summary.  CMA or LPN served as scribe during this visit. History, Physical, and Plan performed by medical provider. The above documentation has been reviewed and is accurate and complete.  Inda Coke, PA-C [place], [place] 06/07/2020  Follow-up: No follow-ups on file.

## 2020-06-08 DIAGNOSIS — Z20822 Contact with and (suspected) exposure to covid-19: Secondary | ICD-10-CM | POA: Diagnosis not present

## 2020-07-09 DIAGNOSIS — Z20822 Contact with and (suspected) exposure to covid-19: Secondary | ICD-10-CM | POA: Diagnosis not present

## 2020-07-14 ENCOUNTER — Ambulatory Visit: Payer: BC Managed Care – PPO | Admitting: Rheumatology

## 2020-08-07 ENCOUNTER — Other Ambulatory Visit: Payer: Self-pay

## 2020-08-07 ENCOUNTER — Encounter: Payer: Self-pay | Admitting: Physician Assistant

## 2020-08-07 ENCOUNTER — Ambulatory Visit: Payer: BC Managed Care – PPO | Admitting: Physician Assistant

## 2020-08-07 VITALS — BP 100/70 | HR 90 | Temp 98.4°F | Ht 64.0 in | Wt 108.5 lb

## 2020-08-07 DIAGNOSIS — R300 Dysuria: Secondary | ICD-10-CM

## 2020-08-07 LAB — POCT URINALYSIS DIPSTICK
Bilirubin, UA: NEGATIVE
Blood, UA: 3
Glucose, UA: NEGATIVE
Ketones, UA: NEGATIVE
Nitrite, UA: NEGATIVE
Protein, UA: NEGATIVE
Spec Grav, UA: <=1.005 — AB (ref 1.010–1.025)
Urobilinogen, UA: 0.2 E.U./dL
pH, UA: 6.5 (ref 5.0–8.0)

## 2020-08-07 MED ORDER — CEPHALEXIN 500 MG PO CAPS: 500.0000 mg | ORAL_CAPSULE | Freq: Two times a day (BID) | ORAL | 0 refills | Status: AC

## 2020-08-07 NOTE — Progress Notes (Signed)
Shannon Foster is a 59 y.o. female here for a new problem.  I acted as a Education administrator for [employer], PA-C Anselmo Pickler, LPN   History of Present Illness:   Chief Complaint  Patient presents with  . Dysuria  . Urinary Urgency    HPI   Urinary symptoms Pt c/o urinary urgency and burning with urination since last night. Also having low backache, denies fever, chills, nausea, vomiting.  Did use AZO. Pushes fluids well. Was busy yesterday and didn't feel like she hydrated at her usual.  She has recurrent UTIs. Has appointment with urology in a few weeks.   Past Medical History:  Diagnosis Date  . Fibrocystic breast disease   . History of shingles 2014  . Migraine   . Raynaud's disease      Social History   Tobacco Use  . Smoking status: Never Smoker  . Smokeless tobacco: Never Used  Vaping Use  . Vaping Use: Never used  Substance Use Topics  . Alcohol use: Yes    Comment: occasional wine  . Drug use: No    Past Surgical History:  Procedure Laterality Date  . HYSTEROSCOPY  2001   BSO- endometriosis  . PELVIC LAPAROSCOPY  3x    endometriosis, LOA  . TOTAL ABDOMINAL HYSTERECTOMY      Family History  Problem Relation Age of Onset  . Parkinson's disease Father   . Hypertension Mother   . Dementia Mother   . Prostate cancer Brother   . Suicidality Sister     Allergies  Allergen Reactions  . Codeine Nausea And Vomiting    Current Medications:   Current Outpatient Medications:  .  cholecalciferol (VITAMIN D) 1000 units tablet, Take 1,000 Units by mouth daily., Disp: , Rfl:  .  FINACEA 15 % cream, APPLY GEL ONCE OR TWICE DAILY TO FACE., Disp: , Rfl: 2 .  fluticasone (FLONASE) 50 MCG/ACT nasal spray, 2 sprays by Each Nare route 2 times daily., Disp: , Rfl:  .  Magnesium 500 MG CAPS, Take 500 mg by mouth daily., Disp: , Rfl:  .  Probiotic Product (VSL#3) CAPS, Take 1 capsule (112 billion) by mouth twice a day, Disp: 60 capsule, Rfl: 1 .   rizatriptan (MAXALT) 5 MG tablet, TAKE 1 TABLET BY MOUTH AS NEEDED FOR MIGRAINE. MAY REPEAT IN 2 HOURS IF NEEDED, Disp: 10 tablet, Rfl: 2   Review of Systems:   ROS  Vitals:   Vitals:   08/07/20 1423  BP: 100/70  Pulse: 90  Temp: 98.4 F (36.9 C)  TempSrc: Temporal  SpO2: 98%  Weight: 108 lb 8 oz (49.2 kg)  Height: 5\' 4"  (1.626 m)     Body mass index is 18.62 kg/m.  Physical Exam:   Physical Exam  Results for orders placed or performed in visit on 10/20/19  Urine culture   Specimen: Urine  Result Value Ref Range   MICRO NUMBER: [id number]    SPECIMEN QUALITY: Adequate    Sample Source URINE    STATUS: FINAL    ISOLATE 1:      Less than 10,000 CFU/mL of single Gram positive organism isolated. No further testing will be performed. If clinically indicated, recollection using a method to minimize contamination, with prompt transfer to Urine Culture Transport Tube, is recommended.  POCT Urinalysis Dipstick (Automated)  Result Value Ref Range   Color, UA yellow    Clarity, UA clear    Glucose, UA Negative Negative   Bilirubin, UA Positive  Ketones, UA Negative    Spec Grav, UA 1.015 1.010 - 1.025   Blood, UA Negative    pH, UA 6.5 5.0 - 8.0   Protein, UA Negative Negative   Urobilinogen, UA 1.0 0.2 or 1.0 E.U./dL   Nitrite, UA Positive    Leukocytes, UA Small (1+) (A) Negative    Assessment and Plan:   Shannon Foster was seen today for dysuria and urinary urgency.  Diagnoses and all orders for this visit:  Dysuria -     POCT urinalysis dipstick -     Urine Culture; Future -     Urine Culture   Suspect acute cystitis. Start oral keflex. Worsening precautions advised.  Culture pending and will adjust treatment as indicated.   CMA or LPN served as scribe during this visit. History, Physical, and Plan performed by medical provider. The above documentation has been reviewed and is accurate and complete.   Inda Coke, PA-C

## 2020-08-07 NOTE — Patient Instructions (Signed)
It was great to see you!  Start 500 mg Keflex twice daily.  General instructions  Make sure you: ? Pee until your bladder is empty. ? Do not hold pee for a long time. ? Empty your bladder after sex. ? Wipe from front to back after pooping if you are a female. Use each tissue one time when you wipe.  Drink enough fluid to keep your pee pale yellow.  Keep all follow-up visits as told by your doctor. This is important. Contact a doctor if:  You do not get better after 1-2 days.  Your symptoms go away and then come back. Get help right away if:  You have very bad back pain.  You have very bad pain in your lower belly.  You have a fever.  You are sick to your stomach (nauseous).  You are throwing up.   I'll be in touch with your culture results.  Take care,  Inda Coke PA-C

## 2020-08-08 ENCOUNTER — Telehealth: Payer: Self-pay

## 2020-08-08 LAB — URINE CULTURE
MICRO NUMBER:: 11055921
Result:: NO GROWTH
SPECIMEN QUALITY:: ADEQUATE

## 2020-08-08 NOTE — Telephone Encounter (Signed)
Pt is checking in on urinalysis results.

## 2020-08-08 NOTE — Telephone Encounter (Signed)
Spoke to pt told her culture will not be back for 2-3 days, it is a send out. Pt verbalized understanding.

## 2020-08-11 ENCOUNTER — Ambulatory Visit: Payer: BC Managed Care – PPO | Admitting: Rheumatology

## 2020-08-28 DIAGNOSIS — D485 Neoplasm of uncertain behavior of skin: Secondary | ICD-10-CM | POA: Diagnosis not present

## 2020-08-28 DIAGNOSIS — L810 Postinflammatory hyperpigmentation: Secondary | ICD-10-CM | POA: Diagnosis not present

## 2020-08-28 DIAGNOSIS — L718 Other rosacea: Secondary | ICD-10-CM | POA: Diagnosis not present

## 2020-08-28 DIAGNOSIS — D2339 Other benign neoplasm of skin of other parts of face: Secondary | ICD-10-CM | POA: Diagnosis not present

## 2020-08-28 DIAGNOSIS — C44722 Squamous cell carcinoma of skin of right lower limb, including hip: Secondary | ICD-10-CM | POA: Diagnosis not present

## 2020-08-28 DIAGNOSIS — L570 Actinic keratosis: Secondary | ICD-10-CM | POA: Diagnosis not present

## 2020-08-28 DIAGNOSIS — L989 Disorder of the skin and subcutaneous tissue, unspecified: Secondary | ICD-10-CM | POA: Diagnosis not present

## 2020-09-08 DIAGNOSIS — N952 Postmenopausal atrophic vaginitis: Secondary | ICD-10-CM | POA: Diagnosis not present

## 2020-09-08 DIAGNOSIS — R310 Gross hematuria: Secondary | ICD-10-CM | POA: Diagnosis not present

## 2020-09-08 DIAGNOSIS — R1084 Generalized abdominal pain: Secondary | ICD-10-CM | POA: Diagnosis not present

## 2020-09-08 DIAGNOSIS — R300 Dysuria: Secondary | ICD-10-CM | POA: Diagnosis not present

## 2020-09-14 DIAGNOSIS — D225 Melanocytic nevi of trunk: Secondary | ICD-10-CM | POA: Diagnosis not present

## 2020-09-14 DIAGNOSIS — L905 Scar conditions and fibrosis of skin: Secondary | ICD-10-CM | POA: Diagnosis not present

## 2020-09-14 DIAGNOSIS — L821 Other seborrheic keratosis: Secondary | ICD-10-CM | POA: Diagnosis not present

## 2020-09-14 DIAGNOSIS — D485 Neoplasm of uncertain behavior of skin: Secondary | ICD-10-CM | POA: Diagnosis not present

## 2020-10-03 DIAGNOSIS — Z1231 Encounter for screening mammogram for malignant neoplasm of breast: Secondary | ICD-10-CM | POA: Diagnosis not present

## 2020-10-03 LAB — HM MAMMOGRAPHY

## 2020-10-11 ENCOUNTER — Encounter: Payer: Self-pay | Admitting: Physician Assistant

## 2021-01-15 ENCOUNTER — Telehealth: Payer: Self-pay

## 2021-01-15 NOTE — Telephone Encounter (Signed)
Please call pt and schedule visit in office. Needs breast check  to order diagnostic testing for pt.

## 2021-01-15 NOTE — Telephone Encounter (Signed)
Patient would like a referral to [facility] due to her having a knot that is painful. She just had a routine a couple months ago and this developed after wards.  Please advise  Thank you

## 2021-01-15 NOTE — Telephone Encounter (Signed)
LVM for patient to call back and schedule appt with [facility]

## 2021-01-29 ENCOUNTER — Other Ambulatory Visit: Payer: Self-pay

## 2021-01-29 ENCOUNTER — Ambulatory Visit: Payer: BC Managed Care – PPO | Admitting: Physician Assistant

## 2021-01-29 ENCOUNTER — Encounter: Payer: Self-pay | Admitting: Physician Assistant

## 2021-01-29 VITALS — BP 118/80 | HR 102 | Temp 98.7°F | Ht 64.0 in | Wt 108.5 lb

## 2021-01-29 DIAGNOSIS — N63 Unspecified lump in unspecified breast: Secondary | ICD-10-CM | POA: Diagnosis not present

## 2021-01-29 NOTE — Progress Notes (Signed)
Shannon Foster is a 60 y.o. female here for a new problem.  I acted as a Education administrator for [employer], PA-C Anselmo Pickler, LPN   History of Present Illness:   Chief Complaint  Patient presents with  . Breast Mass    HPI  Breast mass Pt found lump in right outer breast area a couple weeks ago. It has increased in size. Is tender to touchtouch. Denies discharge from nipple, skin changes.  She has significant hx of fibrocystic disease. Last mammo 10/03/20 with [facility].   Past Medical History:  Diagnosis Date  . Fibrocystic breast disease   . History of shingles 2014  . Migraine   . Raynaud's disease      Social History   Tobacco Use  . Smoking status: Never Smoker  . Smokeless tobacco: Never Used  Vaping Use  . Vaping Use: Never used  Substance Use Topics  . Alcohol use: Yes    Comment: occasional wine  . Drug use: No    Past Surgical History:  Procedure Laterality Date  . HYSTEROSCOPY  2001   BSO- endometriosis  . PELVIC LAPAROSCOPY  3x    endometriosis, LOA  . TOTAL ABDOMINAL HYSTERECTOMY      Family History  Problem Relation Age of Onset  . Parkinson's disease Father   . Hypertension Mother   . Dementia Mother   . Prostate cancer Brother   . Suicidality Sister     Allergies  Allergen Reactions  . Codeine Nausea And Vomiting    Current Medications:   Current Outpatient Medications:  .  cholecalciferol (VITAMIN D) 1000 units tablet, Take 1,000 Units by mouth daily., Disp: , Rfl:  .  FINACEA 15 % cream, APPLY GEL ONCE OR TWICE DAILY TO FACE., Disp: , Rfl: 2 .  fluticasone (FLONASE) 50 MCG/ACT nasal spray, 2 sprays by Each Nare route 2 times daily., Disp: , Rfl:  .  Magnesium 500 MG CAPS, Take 500 mg by mouth daily., Disp: , Rfl:  .  Probiotic Product (VSL#3) CAPS, Take 1 capsule (112 billion) by mouth twice a day, Disp: 60 capsule, Rfl: 1 .  rizatriptan (MAXALT) 5 MG tablet, TAKE 1 TABLET BY MOUTH AS NEEDED FOR MIGRAINE. MAY REPEAT  IN 2 HOURS IF NEEDED, Disp: 10 tablet, Rfl: 2   Review of Systems:   ROS Negative unless otherwise specified per HPI.  Vitals:   Vitals:   01/29/21 1334  BP: 118/80  Pulse: (!) 102  Temp: 98.7 F (37.1 C)  TempSrc: Temporal  SpO2: 99%  Weight: 108 lb 8 oz (49.2 kg)  Height: 5\' 4"  (1.626 m)     Body mass index is 18.62 kg/m.  Physical Exam:   Physical Exam Constitutional:      Appearance: She is well-developed.  HENT:     Head: Normocephalic and atraumatic.  Eyes:     Conjunctiva/sclera: Conjunctivae normal.  Pulmonary:     Effort: Pulmonary effort is normal.  Chest:     Comments: Firm nodule to R outer breast Musculoskeletal:        General: Normal range of motion.     Cervical back: Normal range of motion and neck supple.  Skin:    General: Skin is warm and dry.  Neurological:     Mental Status: She is alert and oriented to person, place, and time.  Psychiatric:        Behavior: Behavior normal.        Thought Content: Thought content normal.  Judgment: Judgment normal.     Assessment and Plan:   Shannon Foster was seen today for breast mass.  Diagnoses and all orders for this visit:  Breast lump -     US BREAST LTD UNI RIGHT INC AXILLA; Future   STAT referral to [facility] to update u/s and further investigate lump.  CMA or LPN served as scribe during this visit. History, Physical, and Plan performed by medical provider. The above documentation has been reviewed and is accurate and complete.  Inda Coke, PA-C

## 2021-01-29 NOTE — Patient Instructions (Signed)
It was great to see you!  Please expect a call from [facility] to change your mammo to much sooner date/time  Currently scheduled at 5/10 at 8:15a  Take care,  Inda Coke PA-C

## 2021-02-02 ENCOUNTER — Ambulatory Visit: Payer: BC Managed Care – PPO | Admitting: Gastroenterology

## 2021-02-08 DIAGNOSIS — N6324 Unspecified lump in the left breast, lower inner quadrant: Secondary | ICD-10-CM | POA: Diagnosis not present

## 2021-02-08 DIAGNOSIS — R922 Inconclusive mammogram: Secondary | ICD-10-CM | POA: Diagnosis not present

## 2021-02-08 DIAGNOSIS — N6322 Unspecified lump in the left breast, upper inner quadrant: Secondary | ICD-10-CM | POA: Diagnosis not present

## 2021-02-08 DIAGNOSIS — N6323 Unspecified lump in the left breast, lower outer quadrant: Secondary | ICD-10-CM | POA: Diagnosis not present

## 2021-02-14 ENCOUNTER — Telehealth: Payer: Self-pay | Admitting: Gastroenterology

## 2021-02-14 NOTE — Telephone Encounter (Signed)
Would you let her know please?

## 2021-02-14 NOTE — Telephone Encounter (Signed)
She wasn't seen for almost 2 years, prefer in person office visit. Thanks

## 2021-02-14 NOTE — Telephone Encounter (Signed)
Inbound call from patient requesting if appt for 02/22/21 can be changed to virtual visit.  Please advise.

## 2021-02-14 NOTE — Telephone Encounter (Signed)
Okay to change her appointment to a virtual? She has My Chart.

## 2021-02-14 NOTE — Telephone Encounter (Signed)
Left detailed voicemail informing patient will need to come in person for visit and can call us back if she needs to reschedule.

## 2021-02-22 ENCOUNTER — Ambulatory Visit: Payer: BC Managed Care – PPO | Admitting: Gastroenterology

## 2021-02-25 DIAGNOSIS — C801 Malignant (primary) neoplasm, unspecified: Secondary | ICD-10-CM

## 2021-02-25 HISTORY — DX: Malignant (primary) neoplasm, unspecified: C80.1

## 2021-02-26 DIAGNOSIS — Z170 Estrogen receptor positive status [ER+]: Secondary | ICD-10-CM | POA: Diagnosis not present

## 2021-02-26 DIAGNOSIS — C50811 Malignant neoplasm of overlapping sites of right female breast: Secondary | ICD-10-CM | POA: Diagnosis not present

## 2021-02-26 DIAGNOSIS — N6011 Diffuse cystic mastopathy of right breast: Secondary | ICD-10-CM | POA: Diagnosis not present

## 2021-02-26 DIAGNOSIS — N6314 Unspecified lump in the right breast, lower inner quadrant: Secondary | ICD-10-CM | POA: Diagnosis not present

## 2021-02-27 ENCOUNTER — Encounter: Payer: Self-pay | Admitting: Physician Assistant

## 2021-02-28 ENCOUNTER — Telehealth: Payer: Self-pay | Admitting: Hematology and Oncology

## 2021-02-28 NOTE — Telephone Encounter (Signed)
Spoke to patient to confirm afternoon [facility] appointment for 5/18, packet will be sent by [facility]

## 2021-03-09 ENCOUNTER — Encounter: Payer: Self-pay | Admitting: *Deleted

## 2021-03-13 ENCOUNTER — Encounter: Payer: Self-pay | Admitting: *Deleted

## 2021-03-13 ENCOUNTER — Other Ambulatory Visit: Payer: Self-pay | Admitting: *Deleted

## 2021-03-13 DIAGNOSIS — C50411 Malignant neoplasm of upper-outer quadrant of right female breast: Secondary | ICD-10-CM

## 2021-03-13 DIAGNOSIS — Z170 Estrogen receptor positive status [ER+]: Secondary | ICD-10-CM | POA: Insufficient documentation

## 2021-03-14 ENCOUNTER — Other Ambulatory Visit: Payer: Self-pay | Admitting: General Surgery

## 2021-03-14 ENCOUNTER — Ambulatory Visit: Payer: BC Managed Care – PPO | Attending: General Surgery | Admitting: Physical Therapy

## 2021-03-14 ENCOUNTER — Encounter: Payer: Self-pay | Admitting: General Practice

## 2021-03-14 ENCOUNTER — Other Ambulatory Visit: Payer: Self-pay

## 2021-03-14 ENCOUNTER — Inpatient Hospital Stay: Payer: BC Managed Care – PPO | Attending: Hematology and Oncology | Admitting: Hematology and Oncology

## 2021-03-14 ENCOUNTER — Other Ambulatory Visit: Payer: Self-pay | Admitting: *Deleted

## 2021-03-14 ENCOUNTER — Inpatient Hospital Stay: Payer: BC Managed Care – PPO

## 2021-03-14 ENCOUNTER — Encounter: Payer: Self-pay | Admitting: Physical Therapy

## 2021-03-14 ENCOUNTER — Ambulatory Visit
Admission: RE | Admit: 2021-03-14 | Discharge: 2021-03-14 | Disposition: A | Discharge status: Home / Self Care | Payer: BC Managed Care – PPO | Source: Ambulatory Visit | Attending: Radiation Oncology | Admitting: Radiation Oncology

## 2021-03-14 ENCOUNTER — Encounter: Payer: Self-pay | Admitting: *Deleted

## 2021-03-14 DIAGNOSIS — C50411 Malignant neoplasm of upper-outer quadrant of right female breast: Secondary | ICD-10-CM | POA: Insufficient documentation

## 2021-03-14 DIAGNOSIS — Z170 Estrogen receptor positive status [ER+]: Secondary | ICD-10-CM | POA: Insufficient documentation

## 2021-03-14 DIAGNOSIS — R293 Abnormal posture: Secondary | ICD-10-CM | POA: Insufficient documentation

## 2021-03-14 LAB — CMP (CANCER CENTER ONLY)
ALT: 21 U/L (ref 0–44)
AST: 26 U/L (ref 15–41)
Albumin: 4.1 g/dL (ref 3.5–5.0)
Alkaline Phosphatase: 74 U/L (ref 38–126)
Anion gap: 10 (ref 5–15)
BUN: 18 mg/dL (ref 8–23)
CO2: 29 mmol/L (ref 22–32)
Calcium: 9.1 mg/dL (ref 8.9–10.3)
Chloride: 105 mmol/L (ref 98–111)
Creatinine: 0.77 mg/dL (ref 0.44–1.00)
GFR, Estimated: >60 mL/min (ref >60)
Glucose, Bld: 104 mg/dL — ABNORMAL HIGH (ref 70–99)
Potassium: 3.7 mmol/L (ref 3.5–5.1)
Sodium: 144 mmol/L (ref 135–145)
Total Bilirubin: 0.3 mg/dL (ref 0.3–1.2)
Total Protein: 7 g/dL (ref 6.5–8.1)

## 2021-03-14 LAB — CBC WITH DIFFERENTIAL (CANCER CENTER ONLY)
Abs Immature Granulocytes: 0.02 10*3/uL (ref 0.00–0.07)
Basophils Absolute: 0 10*3/uL (ref 0.0–0.1)
Basophils Relative: 1 %
Eosinophils Absolute: 0.1 10*3/uL (ref 0.0–0.5)
Eosinophils Relative: 2 %
HCT: 43.6 % (ref 36.0–46.0)
Hemoglobin: 15 g/dL (ref 12.0–15.0)
Immature Granulocytes: 0 %
Lymphocytes Relative: 22 %
Lymphs Abs: 1.1 10*3/uL (ref 0.7–4.0)
MCH: 31.3 pg (ref 26.0–34.0)
MCHC: 34.4 g/dL (ref 30.0–36.0)
MCV: 90.8 fL (ref 80.0–100.0)
Monocytes Absolute: 0.3 10*3/uL (ref 0.1–1.0)
Monocytes Relative: 6 %
Neutro Abs: 3.5 10*3/uL (ref 1.7–7.7)
Neutrophils Relative %: 69 %
Platelet Count: 256 10*3/uL (ref 150–400)
RBC: 4.8 MIL/uL (ref 3.87–5.11)
RDW: 11.5 % (ref 11.5–15.5)
WBC Count: 5.1 10*3/uL (ref 4.0–10.5)
nRBC: 0 % (ref 0.0–0.2)

## 2021-03-14 LAB — GENETIC SCREENING ORDER

## 2021-03-14 NOTE — Progress Notes (Signed)
[place] Psychosocial Distress Screening Spiritual Care  Met with Shannon Foster and her boyfriend in [facility] to introduce [place] team/resources, reviewing distress screen per protocol.  The patient scored a 8 on the Psychosocial Distress Thermometer which indicates severe distress. Also assessed for distress and other psychosocial needs.   ONCBCN DISTRESS SCREENING 03/14/2021  Screening Type Initial Screening  Distress experienced in past week (1-10) 8  Information Concerns Type Lack of info about diagnosis;Lack of info about treatment  Referral to support programs Yes   [name] notes that meeting team and learning about scope of diagnosis and treatment plan helped resolve the unknown, reducing her distress to a 4. She plans to look at [employer] information in detail.   Follow up needed: No. Couple reports no other needs or concerns at this time, but is aware of ongoing Spiritual Care and Support Programming availability, should needs arise or circumstances change.   [place], [place], [facility] Pager [phone] Voicemail [phone]

## 2021-03-14 NOTE — Progress Notes (Signed)
Radiation Oncology         [phone] ________________________________  Initial Outpatient Consultation  Name: Shannon Foster MRN: [id number]  Date: 03/14/2021  DOB: 01-27-1961  CC:Inda Coke, PA  [place], [place], MD   REFERRING PHYSICIAN: Stark Klein, MD  DIAGNOSIS:    ICD-10-CM   1. Malignant neoplasm of upper-outer quadrant of right breast in female, estrogen receptor positive ([place])  C50.411    Z17.0     Cancer Staging Malignant neoplasm of upper-outer quadrant of right breast in female, estrogen receptor positive ([place]) Staging form: Breast, AJCC 8th Edition - Clinical stage from 03/14/2021: Stage IB (cT2, cN0, cM0, G2, ER+, PR+, HER2-) - Signed by Nicholas Lose, MD on 03/14/2021 Stage prefix: Initial diagnosis Histologic grading system: 3 grade system   CHIEF COMPLAINT: Here to discuss management of right breast cancer  HISTORY OF PRESENT ILLNESS::Shannon Foster is a 60 y.o. female who presented with breast abnormality on the following imaging: right diagnostic mammogram on the date of 02/08/2021. Symptoms, if any, at that time, were: palpable lump in the right breast. Ultrasound of the right breast on 02/08/2021 revealed a suspicious 2.4 x 3.3 x 1.5 cm oval mass in the right breast at the 9 o'clock position at posterior depth. There was also noted to be a 0.5 x 0.5 x 0.3 cm oval mass in the right breast at the 5 o'clock position at middle depth. There were no significant abnormalities seen sonographically in the right axilla.   Biopsy of the 3.3 cm mass revealed invasive ductal carcinoma, grade 2, ER/PR positive HER2 negative.  Biopsy of the smaller mass was benign.  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  PREVIOUS RADIATION THERAPY: No  PAST MEDICAL HISTORY:  has a past medical history of Fibrocystic breast disease, History of shingles (2014), Migraine, and Raynaud's disease.    PAST SURGICAL HISTORY: Past Surgical History:   Procedure Laterality Date  . HYSTEROSCOPY  2001   BSO- endometriosis  . PELVIC LAPAROSCOPY  3x    endometriosis, LOA  . TOTAL ABDOMINAL HYSTERECTOMY      FAMILY HISTORY: family history includes Dementia in her mother; Hypertension in her mother; Parkinson's disease in her father; Prostate cancer in her brother; Suicidality in her sister.  SOCIAL HISTORY:  reports that she has never smoked. She has never used smokeless tobacco. She reports current alcohol use. She reports that she does not use drugs.  ALLERGIES: Codeine  MEDICATIONS:  Current Outpatient Medications  Medication Sig Dispense Refill  . Ascorbic Acid (VITAMIN C) 1000 MG tablet Take 1,000 mg by mouth daily.    Marland Kitchen aspirin EC 81 MG tablet Take 81 mg by mouth daily. Swallow whole.    . cholecalciferol (VITAMIN D) 1000 units tablet Take 1,000 Units by mouth daily.    Marland Kitchen FINACEA 15 % cream APPLY GEL ONCE OR TWICE DAILY TO FACE.  2  . fluticasone (FLONASE) 50 MCG/ACT nasal spray 2 sprays by Each Nare route 2 times daily.    Marland Kitchen Lysine HCl (L-FORMULA LYSINE HCL) 500 MG TABS Take by mouth.    . Magnesium 500 MG CAPS Take 500 mg by mouth daily.    . niacinamide 500 MG tablet Take 500 mg by mouth 2 (two) times daily with a meal.    . Omega-3 Fatty Acids (FISH OIL) 1000 MG CAPS Take by mouth.    . Potassium Gluconate 550 (90 K) MG TABS Take by mouth.    . Probiotic Product (  VSL#3) CAPS Take 1 capsule (112 billion) by mouth twice a day 60 capsule 1  . rizatriptan (MAXALT) 5 MG tablet TAKE 1 TABLET BY MOUTH AS NEEDED FOR MIGRAINE. MAY REPEAT IN 2 HOURS IF NEEDED 10 tablet 2   No current facility-administered medications for this encounter.    REVIEW OF SYSTEMS: As above  PHYSICAL EXAM:  vitals were not taken for this visit.   General: Alert and oriented, in no acute distress Heart: Regular in rate and rhythm with no murmurs, rubs, or gallops. Chest: Clear to auscultation bilaterally, with no rhonchi, wheezes, or rales. Extremities:  No cyanosis or edema. Lymphatics: No palpable supraclavicular lymphadenopathy  Musculoskeletal: Ambulatory, good range of motion in shoulders Neurologic: Cranial nerves II through XII are grossly intact. No obvious focalities. Speech is fluent. Coordination is intact. Psychiatric: Judgment and insight are intact. Affect is appropriate. Breasts: Heterogeneously dense breast tissue bilaterally.  The right breast is notable for firmness spanning the majority of the breast (some of this may be postbiopsy swelling), several centimeters in dimension, with overlying bruising of the skin from biopsy. No palpable masses appreciated in  axillae bilaterally.    ECOG = 0  0 - Asymptomatic (Fully active, able to carry on all predisease activities without restriction)  1 - Symptomatic but completely ambulatory (Restricted in physically strenuous activity but ambulatory and able to carry out work of a light or sedentary nature. For example, light housework, office work)  2 - Symptomatic, <50% in bed during the day (Ambulatory and capable of all self care but unable to carry out any work activities. Up and about more than 50% of waking hours)  3 - Symptomatic, >50% in bed, but not bedbound (Capable of only limited self-care, confined to bed or chair 50% or more of waking hours)  4 - Bedbound (Completely disabled. Cannot carry on any self-care. Totally confined to bed or chair)  5 - Death   Eustace Pen MM, Creech RH, Tormey DC, et al. [phone]). "Toxicity and response criteria of the [facility]". [place] Oncol. 5 (6): 649-55   LABORATORY DATA:  Lab Results  Component Value Date   WBC 5.1 03/14/2021   HGB 15.0 03/14/2021   HCT 43.6 03/14/2021   MCV 90.8 03/14/2021   PLT 256 03/14/2021   CMP     Component Value Date/Time   NA 144 03/14/2021 1206   K 3.7 03/14/2021 1206   CL 105 03/14/2021 1206   CO2 29 03/14/2021 1206   GLUCOSE 104 (H) 03/14/2021 1206   BUN 18 03/14/2021  1206   CREATININE 0.77 03/14/2021 1206   CREATININE 0.99 05/25/2015 1430   CALCIUM 9.1 03/14/2021 1206   PROT 7.0 03/14/2021 1206   ALBUMIN 4.1 03/14/2021 1206   AST 26 03/14/2021 1206   ALT 21 03/14/2021 1206   ALKPHOS 74 03/14/2021 1206   BILITOT 0.3 03/14/2021 1206   GFRNONAA >60 03/14/2021 1206   GFRAA >60 10/30/2017 1652       RADIOGRAPHY: As above  IMPRESSION/PLAN: This is a very nice 60 year old woman with right breast cancer  She is hopeful to undergo breast conserving surgery.  An MRI of her breasts is pending so that Dr. Barry Dienes can determine if this is feasible.  Referral to plastic surgery is also pending.  It was a pleasure meeting the patient today. We discussed the risks, benefits, and side effects of radiotherapy. I recommend adjuvant radiotherapy to the right breast to reduce her risk of locoregional recurrence by  2/3.  We discussed that radiation would take approximately 4-6 weeks to complete and that I would give the patient a few weeks to heal following surgery before starting treatment planning.  If chemotherapy were to be given, this would precede radiotherapy (she anticipates Oncotype testing per medical oncology). We spoke about acute effects including skin irritation and fatigue as well as much less common late effects including internal organ injury or irritation. We spoke about the latest technology that is used to minimize the risk of late effects for patients undergoing radiotherapy to the breast or chest wall. No guarantees of treatment were given. The patient is enthusiastic about proceeding with treatment. I look forward to participating in the patient's care.  I will await her referral back to me for postoperative follow-up and eventual CT simulation/treatment planning.  On date of service, in total, I spent 45 minutes on this encounter. Patient was seen in person.   __________________________________________   Eppie Gibson, MD  This document serves as a  record of services personally performed by Eppie Gibson, MD. It was created on his behalf by Clerance Lav, a trained medical scribe. The creation of this record is based on the scribe's personal observations and the provider's statements to them. This document has been checked and approved by the attending provider.

## 2021-03-14 NOTE — Assessment & Plan Note (Signed)
Palpable lump for 1 month, D density breast: 3.3 cm mass, axilla negative, incidental mass at 4:00: Fibrocystic change, biopsy revealed grade 2 IDC with perineural invasion, ER 90%, PR 95%, Ki-67 10%, HER2 negative by [facility]  Pathology and radiology counseling:Discussed with the patient, the details of pathology including the type of breast cancer,the clinical staging, the significance of ER, PR and HER-2/neu receptors and the implications for treatment. After reviewing the pathology in detail, we proceeded to discuss the different treatment options between surgery, radiation, chemotherapy, antiestrogen therapies.  Recommendations: 1. Breast conserving surgery followed by 2. Oncotype DX testing to determine if chemotherapy would be of any benefit followed by 3. Adjuvant radiation therapy followed by 4. Adjuvant antiestrogen therapy 5. Breast MRI will be obtained because of high density left breast  Oncotype counseling: I discussed Oncotype DX test. I explained to the patient that this is a 21 gene panel to evaluate patient tumors DNA to calculate recurrence score. This would help determine whether patient has high risk or low risk breast cancer. She understands that if her tumor was found to be high risk, she would benefit from systemic chemotherapy. If low risk, no need of chemotherapy.  Return to clinic after surgery to discuss final pathology report and then determine if Oncotype DX testing will need to be sent.

## 2021-03-14 NOTE — Progress Notes (Signed)
Error

## 2021-03-14 NOTE — Therapy (Signed)
[place], [place], [address] Phone: [phone]   Fax:  [phone]  Physical Therapy Evaluation  Patient Details  Name: Shannon Foster MRN: [id number] Date of Birth: 10/11/1961 Referring Provider (PT): Dr. Stark Klein   Encounter Date: 03/14/2021   PT End of Session - 03/14/21 1600    Visit Number 1    Number of Visits 2    Date for PT Re-Evaluation 05/09/21    PT Start Time 1336    PT Stop Time 1343   Also saw pt from 1424-1444 for a total of 27 min   PT Time Calculation (min) 7 min    Activity Tolerance Treatment limited secondary to agitation    Behavior During Therapy [facility] for tasks assessed/performed           Past Medical History:  Diagnosis Date  . Fibrocystic breast disease   . History of shingles 2014  . Migraine   . Raynaud's disease     Past Surgical History:  Procedure Laterality Date  . HYSTEROSCOPY  2001   BSO- endometriosis  . PELVIC LAPAROSCOPY  3x    endometriosis, LOA  . TOTAL ABDOMINAL HYSTERECTOMY      There were no vitals filed for this visit.    Subjective Assessment - 03/14/21 1441    Subjective Patient reports she is here today to be seen by her medical team for her newly diagnosed right breast cancer.    Pertinent History Patient was diagnosed on 02/08/2021 with right invasive ductal carcinoma breast cancer. It measures 3.3 cm and is located in the upper outer quadrant. It is ER/PR positive and HER2 negative with a Ki67 of 10%.    Patient Stated Goals Reduce lymphedema risk and              [facility] PT Assessment - 03/14/21 0001      Assessment   Medical Diagnosis Right breast cancer    Referring Provider (PT) Dr. Stark Klein    Onset Date/Surgical Date 02/08/21    Hand Dominance Right    Prior Therapy none      Precautions   Precautions Other (comment)    Precaution Comments active cancer      Restrictions   Weight Bearing Restrictions No       Balance Screen   Has the patient fallen in the past 6 months No    Has the patient had a decrease in activity level because of a fear of falling?  No    Is the patient reluctant to leave their home because of a fear of falling?  No      Home Environment   Living Environment Private residence    Living Arrangements Alone    Available Help at Discharge Family      Prior Function   Level of Independence Independent    Vocation Full time employment    Counsellor    Leisure She bikes and walks regularly      Cognition   Overall Cognitive Status Within Functional Limits for tasks assessed      Posture/Postural Control   Posture/Postural Control Postural limitations    Postural Limitations Rounded Shoulders;Forward head      ROM / Strength   AROM / PROM / Strength AROM;Strength      AROM   Overall AROM Comments Cervical AROM is WNL    AROM Assessment Site Shoulder    Right/Left Shoulder Right;Left  Right Shoulder Extension 46 Degrees    Right Shoulder Flexion 159 Degrees    Right Shoulder ABduction 167 Degrees    Right Shoulder Internal Rotation 74 Degrees    Right Shoulder External Rotation 90 Degrees    Left Shoulder Extension 48 Degrees    Left Shoulder Flexion 150 Degrees    Left Shoulder ABduction 168 Degrees    Left Shoulder Internal Rotation 77 Degrees    Left Shoulder External Rotation 81 Degrees      Strength   Overall Strength Within functional limits for tasks performed             LYMPHEDEMA/ONCOLOGY QUESTIONNAIRE - 03/14/21 0001      Type   Cancer Type Right breast cancer      Lymphedema Assessments   Lymphedema Assessments Upper extremities      Right Upper Extremity Lymphedema   10 cm Proximal to Olecranon Process 23 cm    Olecranon Process 21.5 cm    10 cm Proximal to Ulnar Styloid Process 18 cm    Just Proximal to Ulnar Styloid Process 13.4 cm    Across Hand at [employer]  18.6 cm    At [place] of 2nd Digit 6 cm      Left Upper Extremity Lymphedema   10 cm Proximal to Olecranon Process 22.2 cm    Olecranon Process 21.1 cm    10 cm Proximal to Ulnar Styloid Process 16.8 cm    Just Proximal to Ulnar Styloid Process 13 cm    Across Hand at [employer] 18.4 cm    At [place] of 2nd Digit 5.7 cm           L-DEX FLOWSHEETS - 03/14/21 1500      L-DEX LYMPHEDEMA SCREENING   Measurement Type Unilateral    L-DEX MEASUREMENT EXTREMITY Upper Extremity    POSITION  Standing    DOMINANT SIDE Right    At Risk Side Right    BASELINE SCORE (UNILATERAL) 2.6           The patient was assessed using the L-Dex machine today to produce a lymphedema index baseline score. The patient will be reassessed on a regular basis (typically every 3 months) to obtain new L-Dex scores. If the score is > 6.5 points away from his/her baseline score indicating onset of subclinical lymphedema, it will be recommended to wear a compression garment for 4 weeks, 12 hours per day and then be reassessed. If the score continues to be > 6.5 points from baseline at reassessment, we will initiate lymphedema treatment. Assessing in this manner has a 95% rate of preventing clinically significant lymphedema.      Katina Dung - 03/14/21 0001    Open a tight or new jar No difficulty    Do heavy household chores (wash walls, wash floors) No difficulty    Carry a shopping bag or briefcase No difficulty    Wash your back No difficulty    Use a knife to cut food No difficulty    Recreational activities in which you take some force or impact through your arm, shoulder, or hand (golf, hammering, tennis) No difficulty    During the past week, to what extent has your arm, shoulder or hand problem interfered with your normal social activities with family, friends, neighbors, or groups? Not at all    During the past week, to what extent has your arm, shoulder or hand problem limited your work or other regular  daily  activities Not at all    Arm, shoulder, or hand pain. None    Tingling (pins and needles) in your arm, shoulder, or hand None    Difficulty Sleeping No difficulty    DASH Score 0 %            Objective measurements completed on examination: See above findings.        Patient was instructed today in a home exercise program today for post op shoulder range of motion. These included active assist shoulder flexion in sitting, scapular retraction, wall walking with shoulder abduction, and hands behind head external rotation.  She was encouraged to do these twice a day, holding 3 seconds and repeating 5 times when permitted by her physician.           PT Education - 03/14/21 1559    Education Details Lymphedema risk reduction and post op shoulder ROM HEP    Person(s) Educated Patient;Other (comment)   boyfriend   Methods Explanation;Demonstration;Handout    Comprehension Verbalized understanding;Returned demonstration               PT Long Term Goals - 03/14/21 1606      PT LONG TERM GOAL #1   Title Patient will demonstrate she has regained full shoulder ROM and function post operatively compared to baselines.    Time 8    Period Weeks    Status New    Target Date 05/09/21           Breast Clinic Goals - 03/14/21 1606      Patient will be able to verbalize understanding of pertinent lymphedema risk reduction practices relevant to her diagnosis specifically related to skin care.   Time 1    Period Days    Status Achieved      Patient will be able to return demonstrate and/or verbalize understanding of the post-op home exercise program related to regaining shoulder range of motion.   Time 1    Period Days    Status Achieved      Patient will be able to verbalize understanding of the importance of attending the postoperative After Breast Cancer Class for further lymphedema risk reduction education and therapeutic exercise.   Time 1    Period Days     Status Achieved                 Plan - 03/14/21 1602    Clinical Impression Statement Patient was diagnosed on 02/08/2021 with right invasive ductal carcinoma breast cancer. It measures 3.3 cm and is located in the upper outer quadrant. It is ER/PR positive and HER2 negative with a Ki67 of 10%. Her multidisciplinary medical team met prior to her assessments to determine a recommended treatment plan. She is planning to have a right lumpectomy and sentinel node biopsy followed by Oncotype teting, radiation, and anti-estrogen therapy. She will benefit from a post op PT reassessment to determine needs and from L-dex screens every 3 months for 2 years to detect subclinical lymphedema.    Stability/Clinical Decision Making Stable/Uncomplicated    Clinical Decision Making Low    Rehab Potential Excellent    PT Frequency --   Eval and 1 f/u visit   PT Treatment/Interventions ADLs/Self Care Home Management;Therapeutic exercise;Patient/family education    PT Next Visit Plan Will reassess 3-4 weeks post op    PT Home Exercise Plan Post op shoulder ROM HEP    Consulted and Agree with Plan of Care Patient;Family member/caregiver  Family Member Consulted Boyfriend           Patient will benefit from skilled therapeutic intervention in order to improve the following deficits and impairments:  Postural dysfunction,Decreased range of motion,Pain,Impaired UE functional use,Decreased knowledge of precautions  Visit Diagnosis: Malignant neoplasm of upper-outer quadrant of right breast in female, estrogen receptor positive ([place]) - Plan: PT plan of care cert/re-cert  Abnormal posture - Plan: PT plan of care cert/re-cert   Patient will follow up at outpatient cancer rehab 3-4 weeks following surgery.  If the patient requires physical therapy at that time, a specific plan will be dictated and sent to the referring physician for approval. The patient was educated today on appropriate basic range of  motion exercises to begin post operatively and the importance of attending the After Breast Cancer class following surgery.  Patient was educated today on lymphedema risk reduction practices as it pertains to recommendations that will benefit the patient immediately following surgery.  She verbalized good understanding.     Problem List Patient Active Problem List   Diagnosis Date Noted  . Malignant neoplasm of upper-outer quadrant of right breast in female, estrogen receptor positive ([place]) 03/13/2021  . Osteopenia 05/22/2017  . Vitamin D deficiency 05/22/2017  . Migraine   . Raynaud's disease   . Fibrocystic breast disease    Annia Friendly, [place] 03/14/21 4:11 PM  [place], [place], [address] Phone: [phone]   Fax:  [phone]  Name: Shannon Foster MRN: [id number] Date of Birth: [birth year]

## 2021-03-14 NOTE — Patient Instructions (Signed)

## 2021-03-14 NOTE — Progress Notes (Signed)
[facility] NOTE  Patient Care Team: Inda Coke, [place] as PCP - General (Physician Assistant) Mauro Kaufmann, RN as Oncology Nurse Navigator Rockwell Germany, RN as Oncology Nurse Navigator Stark Klein, MD as Consulting Physician (General Surgery) Nicholas Lose, MD as Consulting Physician (Hematology and Oncology) Eppie Gibson, MD as Attending Physician (Radiation Oncology)  CHIEF COMPLAINTS/PURPOSE OF CONSULTATION:  Newly diagnosed breast cancer  HISTORY OF PRESENTING ILLNESS:  Shannon Foster 60 y.o. female is here because of recent diagnosis of right breast cancer.  Patient originally felt a lump in the right breast in December and subsequently had a mammogram which did not reveal any abnormalities possibly because she had extremely dense breast tissue.  She continued to feel the lump and it was slowly growing in size and therefore she underwent further evaluation with additional mammograms which now reveal a 3.3 cm mass at right breast and an additional incidental mass at 4 o'clock position.  The primary tumor biopsy came back as grade 2 IDC with perineural invasion that was ER 90%, PR 95%, Ki-67 10%, HER2 negative by FISH.  Biopsy of the 4:00 centimeter mass came back as fibrocystic change she presented this morning to the multidisciplinary tumor board and she is here today accompanied by her husband to discuss treatment plan.  I reviewed her records extensively and collaborated the history with the patient.  SUMMARY OF ONCOLOGIC HISTORY: Oncology History  Malignant neoplasm of upper-outer quadrant of right breast in female, estrogen receptor positive ([place])  03/13/2021 Initial Diagnosis   Palpable lump for 1 month, D density breast: 3.3 cm mass, axilla negative, incidental mass at 4:00: Fibrocystic change, biopsy revealed grade 2 IDC with perineural invasion, ER 90%, PR 95%, Ki-67 10%, HER2 negative by [facility]   03/14/2021 Cancer Staging   Staging form:  Breast, AJCC 8th Edition - Clinical stage from 03/14/2021: Stage IB (cT2, cN0, cM0, G2, ER+, PR+, HER2-) - Signed by Nicholas Lose, MD on 03/14/2021 Stage prefix: Initial diagnosis Histologic grading system: 3 grade system      MEDICAL HISTORY:  Past Medical History:  Diagnosis Date  . Fibrocystic breast disease   . History of shingles 2014  . Migraine   . Raynaud's disease     SURGICAL HISTORY: Past Surgical History:  Procedure Laterality Date  . HYSTEROSCOPY  2001   BSO- endometriosis  . PELVIC LAPAROSCOPY  3x    endometriosis, LOA  . TOTAL ABDOMINAL HYSTERECTOMY      SOCIAL HISTORY: Social History   Socioeconomic History  . Marital status: Divorced    Spouse name: Not on file  . Number of children: 0  . Years of education: Not on file  . Highest education level: Not on file  Occupational History  . Not on file  Tobacco Use  . Smoking status: Never Smoker  . Smokeless tobacco: Never Used  Vaping Use  . Vaping Use: Never used  Substance and Sexual Activity  . Alcohol use: Yes    Comment: occasional wine  . Drug use: No  . Sexual activity: Yes    Partners: Male    Birth control/protection: Surgical    Comment: TAH/BSO  Other Topics Concern  . Not on file  Social History Narrative  . Not on file   Social Determinants of Health   Financial Resource Strain: Not on file  Food Insecurity: Not on file  Transportation Needs: Not on file  Physical Activity: Not on file  Stress: Not on file  Social  Connections: Not on file  Intimate Partner Violence: Not on file    FAMILY HISTORY: Family History  Problem Relation Age of Onset  . Parkinson's disease Father   . Hypertension Mother   . Dementia Mother   . Prostate cancer Brother   . Suicidality Sister     ALLERGIES:  is allergic to codeine.  MEDICATIONS:  Current Outpatient Medications  Medication Sig Dispense Refill  . Ascorbic Acid (VITAMIN C) 1000 MG tablet Take 1,000 mg by mouth daily.    Marland Kitchen  aspirin EC 81 MG tablet Take 81 mg by mouth daily. Swallow whole.    . cholecalciferol (VITAMIN D) 1000 units tablet Take 1,000 Units by mouth daily.    Marland Kitchen FINACEA 15 % cream APPLY GEL ONCE OR TWICE DAILY TO FACE.  2  . fluticasone (FLONASE) 50 MCG/ACT nasal spray 2 sprays by Each Nare route 2 times daily.    Marland Kitchen Lysine HCl (L-FORMULA LYSINE HCL) 500 MG TABS Take by mouth.    . Magnesium 500 MG CAPS Take 500 mg by mouth daily.    . niacinamide 500 MG tablet Take 500 mg by mouth 2 (two) times daily with a meal.    . Omega-3 Fatty Acids (FISH OIL) 1000 MG CAPS Take by mouth.    . Potassium Gluconate 550 (90 K) MG TABS Take by mouth.    . Probiotic Product (VSL#3) CAPS Take 1 capsule (112 billion) by mouth twice a day 60 capsule 1  . rizatriptan (MAXALT) 5 MG tablet TAKE 1 TABLET BY MOUTH AS NEEDED FOR MIGRAINE. MAY REPEAT IN 2 HOURS IF NEEDED 10 tablet 2   No current facility-administered medications for this visit.    REVIEW OF SYSTEMS:   Constitutional: Denies fevers, chills or abnormal night sweats   All other systems were reviewed with the patient and are negative.  PHYSICAL EXAMINATION: ECOG PERFORMANCE STATUS: 1 - Symptomatic but completely ambulatory  Vitals:   03/14/21 1254  BP: 121/74  Pulse: 88  Resp: 17  Temp: (!) 97 F (36.1 C)  SpO2: 100%   Filed Weights   03/14/21 1254  Weight: 107 lb 6.4 oz (48.7 kg)   LABORATORY DATA:  I have reviewed the data as listed Lab Results  Component Value Date   WBC 5.1 03/14/2021   HGB 15.0 03/14/2021   HCT 43.6 03/14/2021   MCV 90.8 03/14/2021   PLT 256 03/14/2021   Lab Results  Component Value Date   NA 144 03/14/2021   K 3.7 03/14/2021   CL 105 03/14/2021   CO2 29 03/14/2021    RADIOGRAPHIC STUDIES: I have personally reviewed the radiological reports and agreed with the findings in the report.  ASSESSMENT AND PLAN:  Malignant neoplasm of upper-outer quadrant of right breast in female, estrogen receptor positive  ([facility]) Palpable lump for 1 month, D density breast: 3.3 cm mass, axilla negative, incidental mass at 4:00: Fibrocystic change, biopsy revealed grade 2 IDC with perineural invasion, ER 90%, PR 95%, Ki-67 10%, HER2 negative by [facility]  Pathology and radiology counseling:Discussed with the patient, the details of pathology including the type of breast cancer,the clinical staging, the significance of ER, PR and HER-2/neu receptors and the implications for treatment. After reviewing the pathology in detail, we proceeded to discuss the different treatment options between surgery, radiation, chemotherapy, antiestrogen therapies.  Recommendations: 1. Breast conserving surgery followed by 2. Oncotype DX testing to determine if chemotherapy would be of any benefit followed by 3. Adjuvant radiation therapy followed  by 4. Adjuvant antiestrogen therapy 5. Breast MRI will be obtained because of high density left breast  Oncotype counseling: I discussed Oncotype DX test. I explained to the patient that this is a 21 gene panel to evaluate patient tumors DNA to calculate recurrence score. This would help determine whether patient has high risk or low risk breast cancer. She understands that if her tumor was found to be high risk, she would benefit from systemic chemotherapy. If low risk, no need of chemotherapy.  Return to clinic after surgery to discuss final pathology report and then determine if Oncotype DX testing will need to be sent.     All questions were answered. The patient knows to call the clinic with any problems, questions or concerns.    Harriette Ohara, MD 03/14/21

## 2021-03-15 ENCOUNTER — Telehealth: Payer: Self-pay | Admitting: Hematology and Oncology

## 2021-03-15 NOTE — Telephone Encounter (Signed)
Scheduled appt per 5/19 sch msg. Called pt, no answer. Left msg with appt date and time.

## 2021-03-16 ENCOUNTER — Encounter: Payer: Self-pay | Admitting: Radiation Oncology

## 2021-03-20 ENCOUNTER — Encounter: Payer: Self-pay | Admitting: *Deleted

## 2021-03-20 ENCOUNTER — Telehealth: Payer: Self-pay | Admitting: *Deleted

## 2021-03-20 NOTE — Telephone Encounter (Signed)
Left message for a return phone call to follow up from [facility] 5/18.

## 2021-03-21 ENCOUNTER — Ambulatory Visit
Admission: RE | Admit: 2021-03-21 | Discharge: 2021-03-21 | Disposition: A | Discharge status: Home / Self Care | Payer: BC Managed Care – PPO | Source: Ambulatory Visit | Attending: General Surgery | Admitting: General Surgery

## 2021-03-21 ENCOUNTER — Other Ambulatory Visit: Payer: Self-pay

## 2021-03-21 DIAGNOSIS — C50911 Malignant neoplasm of unspecified site of right female breast: Secondary | ICD-10-CM | POA: Diagnosis not present

## 2021-03-21 DIAGNOSIS — Z170 Estrogen receptor positive status [ER+]: Secondary | ICD-10-CM

## 2021-03-21 DIAGNOSIS — C50411 Malignant neoplasm of upper-outer quadrant of right female breast: Secondary | ICD-10-CM

## 2021-03-21 MED ORDER — GADOBUTROL 1 MMOL/ML IV SOLN
5.0000 mL | Freq: Once | INTRAVENOUS | Status: AC | PRN
  Administered 2021-03-21: 5 mL via INTRAVENOUS

## 2021-03-22 ENCOUNTER — Telehealth: Payer: Self-pay | Admitting: *Deleted

## 2021-03-22 ENCOUNTER — Encounter: Payer: Self-pay | Admitting: *Deleted

## 2021-03-22 NOTE — Telephone Encounter (Signed)
Received a call from pt significant other Ettamae Barkett (on hippa) to discuss moving up pt sx date. Discussed that I will reach out to Dr. Barry Dienes and her sx scheduler to see if this is possible. Marya Amsler informed that if the pts sx can't be moved up the pt would like to switch to a different surgeon that could get her sx done earlier. Dr. Barry Dienes informed of pt wishes.

## 2021-04-03 ENCOUNTER — Other Ambulatory Visit: Payer: Self-pay

## 2021-04-03 ENCOUNTER — Encounter (HOSPITAL_BASED_OUTPATIENT_CLINIC_OR_DEPARTMENT_OTHER): Payer: Self-pay | Admitting: General Surgery

## 2021-04-09 DIAGNOSIS — Z20822 Contact with and (suspected) exposure to covid-19: Secondary | ICD-10-CM | POA: Diagnosis not present

## 2021-04-09 NOTE — Progress Notes (Signed)
CHG soap and Ensure presurgery drink given to patient's SO, with instructions for both. Confirmed arrival time on DOS. All questions answered.

## 2021-04-09 NOTE — H&P (Signed)
Hardin Negus Appointment: 03/14/2021 1:00 PM Location: [facility] Patient #: [id number] DOB: [birth year] Married / Language: English / Race: White Female   History of Present Illness Stark Klein MD; 03/14/2021 4:09 PM) The patient is a 60 year old female who presents with breast cancer.Pt is a 60 yo F with a new diagnosis of breast cancer 02/2021. She is referred for consultation by Dr. Luan Pulling.  She presented with a palpable right breast mass.  Diagnostic imaging was performed and showed a 3.3 cm mass at 9 o'clock on the right. This was an area of distortion on the mammogram.  Breast density is D.   Axilla was negative.  There was an additional mass inferomedially in the right breast that was benign.  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  She is nulliparous and had menarche at age 44.  She had endometriosis and wasn't able to have children.  She has had a hysterectomy so is not sure when menopause occurred.  She used HRT for around 5 years.    She works at [employer].  Of note, she has significant post op nausea and vomiting.     Films are from [place] and reviewed in [facility] fashion today.   Pathology also reviewed.   CBC and CMET essentially normal 03/14/21       Past Surgical History Conni Slipper, RN; 03/14/2021 8:08 AM) Breast Biopsy   Right. Hysterectomy (not due to cancer) - Complete    Diagnostic Studies History Conni Slipper, RN; 03/14/2021 8:08 AM) Colonoscopy   5-10 years ago Mammogram   within last year Pap Smear   >5 years ago  Medication History Conni Slipper, RN; 03/14/2021 8:08 AM) Medications Reconciled   Social History Conni Slipper, RN; 03/14/2021 8:08 AM) Alcohol use   Occasional alcohol use. Caffeine use   Coffee. No drug use   Tobacco use   Never smoker.  Family History Conni Slipper, RN;  03/14/2021 8:08 AM) Alcohol Abuse   Father. Hypertension   Mother. Migraine Headache   Father. Prostate Cancer   Brother.  Pregnancy / Birth History Conni Slipper, RN; 03/14/2021 8:08 AM) Age at menarche   77 years. Age of menopause   <43 Gravida   0 Para   0 Regular periods    Other Problems Conni Slipper, RN; 03/14/2021 8:08 AM) Breast Cancer   General anesthesia - complications   Lump In Breast   Migraine Headache      Review of Systems Conni Slipper RN; 03/14/2021 8:08 AM) General Not Present- Appetite Loss, Chills, Fatigue, Fever, Night Sweats, Weight Gain and Weight Loss. Skin Present- Dryness. Not Present- Change in Wart/Mole, Hives, Jaundice, New Lesions, Non-Healing Wounds, Rash and Ulcer. HEENT Present- Wears glasses/contact lenses. Not Present- Earache, Hearing Loss, Hoarseness, Nose Bleed, Oral Ulcers, Ringing in the Ears, Seasonal Allergies, Sinus Pain, Sore Throat, Visual Disturbances and Yellow Eyes. Respiratory Not Present- Bloody sputum, Chronic Cough, Difficulty Breathing, Snoring and Wheezing. Breast Present- Breast Mass and Breast Pain. Not Present- Nipple Discharge and Skin Changes. Gastrointestinal Present- Change in Bowel Habits. Not Present- Abdominal Pain, Bloating, Bloody Stool, Chronic diarrhea, Constipation, Difficulty Swallowing, Excessive gas, Gets full quickly at meals, Hemorrhoids, Indigestion, Nausea, Rectal Pain and Vomiting. Female Genitourinary Present- Frequency. Not Present- Nocturia, Painful Urination, Pelvic Pain and Urgency. Musculoskeletal Not Present- Back Pain, Joint Pain, Joint Stiffness, Muscle Pain,  Muscle Weakness and Swelling of Extremities. Neurological Not Present- Decreased Memory, Fainting, Headaches, Numbness, Seizures, Tingling, Tremor, Trouble walking and Weakness. Psychiatric Present- Anxiety and Change in Sleep Pattern. Not Present- Bipolar, Depression, Fearful and Frequent crying. Endocrine Present- Cold Intolerance and Heat  Intolerance. Not Present- Excessive Hunger, Hair Changes, Hot flashes and New Diabetes. Hematology Not Present- Blood Thinners, Easy Bruising, Excessive bleeding, Gland problems, HIV and Persistent Infections.  Vitals Stark Klein MD; 03/14/2021 4:05 PM) 03/14/2021 4:05 PM Weight: 107.4 lb   Height: 64 in  Body Surface Area: 1.5 m   Body Mass Index: 18.43 kg/m   Temp.: 97 F    Pulse: 88 (Regular)    Resp.: 17 (Unlabored)    BP: 121/74(Sitting, Left Arm, Standard)       Physical Exam Stark Klein MD; 03/14/2021 4:06 PM) General Mental Status - Alert. General Appearance - Consistent with stated age. Hydration - Well hydrated. Voice - Normal.  Head and Neck Head - normocephalic, atraumatic with no lesions or palpable masses. Trachea - midline. Thyroid Gland Characteristics - normal size and consistency.  Eye Eyeball - Bilateral - Extraocular movements intact. Sclera/Conjunctiva - Bilateral - No scleral icterus.  Chest and Lung Exam Chest and lung exam reveals  - quiet, even and easy respiratory effort with no use of accessory muscles and on auscultation, normal breath sounds, no adventitious sounds and normal vocal resonance. Inspection Chest Wall - Normal. Back - normal.  Breast Note:  mobile mass wtih some tenderness at 9 o'clock on the right. It feels multilobulated and linearly oriented.  faint bruising present.  No other masses on right.  Breasts are relatively symmetric.  very dense.  no LAD.  no nipple retraction or skin dimpling. left breast with palpable mass in the LOQ (benign).   Cardiovascular Cardiovascular examination reveals  - normal heart sounds, regular rate and rhythm with no murmurs and normal pedal pulses bilaterally.  Abdomen Inspection Inspection of the abdomen reveals - No Hernias. Palpation/Percussion Palpation and Percussion of the abdomen reveal - Soft, Non Tender, No Rebound tenderness, No Rigidity (guarding) and No  hepatosplenomegaly. Auscultation Auscultation of the abdomen reveals - Bowel sounds normal.  Neurologic Neurologic evaluation reveals  - alert and oriented x 3 with no impairment of recent or remote memory. Mental Status - Normal.  Musculoskeletal Global Assessment  - Note:  no gross deformities.  Normal Exam - Left - Upper Extremity Strength Normal and Lower Extremity Strength Normal. Normal Exam - Right - Upper Extremity Strength Normal and Lower Extremity Strength Normal.  Lymphatic Head & Neck  General Head & Neck Lymphatics: Bilateral - Description - Normal. Axillary  General Axillary Region: Bilateral - Description - Normal. Tenderness - Non Tender. Femoral & Inguinal  Generalized Femoral & Inguinal Lymphatics: Bilateral - Description - No Generalized lymphadenopathy.    Assessment & Plan Stark Klein MD; 03/14/2021 4:09 PM) MALIGNANT NEOPLASM OF UPPER-OUTER QUADRANT OF RIGHT BREAST IN FEMALE, ESTROGEN RECEPTOR POSITIVE (C50.411) Impression: Pt with a new diagnosis of cT2N0 right breast cancer. Will get MRI given breast density and size of mass. If no surprises, can plan breast conservation if desired.  discussed lumpectomy vs mastectomy. I also offered reconstruction,. Will try conservation if possible. I reviewed risk of positive margins and having to have multiple surgeries.  We will plan lumpectomy pending MRI. This will be followed by oncotype or mammaprint depending on nodes, XRT, and antiestrogen tx.  The surgical procedure was described to the patient. I discussed the incision type and  location and that we would need radiology involved on with a wire or seed marker and/or sentinel node.  The risks and benefits of the procedure were described to the patient and she wishes to proceed.  We discussed the risks bleeding, infection, damage to other structures, need for further procedures/surgeries. We discussed the risk of seroma. The patient was advised if the area in  the breast in cancer, we may need to go back to surgery for additional tissue to obtain negative margins or for a lymph node biopsy. The patient was advised that these are the most common complications, but that others can occur as well. They were advised against taking aspirin or other anti-inflammatory agents/blood thinners the week before surgery.  60 min spent in evaluation, examination, counseling, and coordination of care. >50% spent in counseling. Current Plans You are being scheduled for surgery - Our schedulers will call you.   You should hear from our office's scheduling department within 5 working days about the location, date, and time of surgery.  We try to make accommodations for patient's preferences in scheduling surgery, but sometimes the OR schedule or the surgeon's schedule prevents [place] from making those accommodations.  If you have not heard from our office [phone]) in 5 working days, call the office and ask for your surgeon's nurse.  If you have other questions about your diagnosis, plan, or surgery, call the office and ask for your surgeon's nurse.  Pt Education - flb breast cancer surgery: discussed with patient and provided information.

## 2021-04-12 ENCOUNTER — Encounter (HOSPITAL_BASED_OUTPATIENT_CLINIC_OR_DEPARTMENT_OTHER)
Admission: RE | Disposition: A | Discharge status: Home / Self Care | Payer: Self-pay | Source: Home / Self Care | Attending: General Surgery

## 2021-04-12 ENCOUNTER — Ambulatory Visit (HOSPITAL_BASED_OUTPATIENT_CLINIC_OR_DEPARTMENT_OTHER)
Admission: RE | Admit: 2021-04-12 | Discharge: 2021-04-12 | Disposition: A | Discharge status: Home / Self Care | Payer: BC Managed Care – PPO | Attending: General Surgery | Admitting: General Surgery

## 2021-04-12 ENCOUNTER — Ambulatory Visit (HOSPITAL_COMMUNITY)
Admission: RE | Admit: 2021-04-12 | Discharge: 2021-04-12 | Disposition: A | Discharge status: Home / Self Care | Payer: BC Managed Care – PPO | Source: Ambulatory Visit | Attending: General Surgery | Admitting: General Surgery

## 2021-04-12 ENCOUNTER — Other Ambulatory Visit: Payer: Self-pay

## 2021-04-12 ENCOUNTER — Ambulatory Visit (HOSPITAL_BASED_OUTPATIENT_CLINIC_OR_DEPARTMENT_OTHER): Payer: BC Managed Care – PPO | Admitting: Anesthesiology

## 2021-04-12 ENCOUNTER — Encounter (HOSPITAL_BASED_OUTPATIENT_CLINIC_OR_DEPARTMENT_OTHER): Payer: Self-pay | Admitting: General Surgery

## 2021-04-12 DIAGNOSIS — E559 Vitamin D deficiency, unspecified: Secondary | ICD-10-CM | POA: Diagnosis not present

## 2021-04-12 DIAGNOSIS — C50411 Malignant neoplasm of upper-outer quadrant of right female breast: Secondary | ICD-10-CM | POA: Diagnosis not present

## 2021-04-12 DIAGNOSIS — Z820 Family history of epilepsy and other diseases of the nervous system: Secondary | ICD-10-CM | POA: Insufficient documentation

## 2021-04-12 DIAGNOSIS — C50911 Malignant neoplasm of unspecified site of right female breast: Secondary | ICD-10-CM | POA: Diagnosis not present

## 2021-04-12 DIAGNOSIS — Z170 Estrogen receptor positive status [ER+]: Secondary | ICD-10-CM | POA: Diagnosis not present

## 2021-04-12 DIAGNOSIS — Z8042 Family history of malignant neoplasm of prostate: Secondary | ICD-10-CM | POA: Diagnosis not present

## 2021-04-12 DIAGNOSIS — C773 Secondary and unspecified malignant neoplasm of axilla and upper limb lymph nodes: Secondary | ICD-10-CM | POA: Insufficient documentation

## 2021-04-12 DIAGNOSIS — Z8249 Family history of ischemic heart disease and other diseases of the circulatory system: Secondary | ICD-10-CM | POA: Insufficient documentation

## 2021-04-12 DIAGNOSIS — G8918 Other acute postprocedural pain: Secondary | ICD-10-CM | POA: Diagnosis not present

## 2021-04-12 HISTORY — PX: BREAST LUMPECTOMY WITH SENTINEL LYMPH NODE BIOPSY: SHX5597

## 2021-04-12 HISTORY — DX: Nausea with vomiting, unspecified: R11.2

## 2021-04-12 HISTORY — DX: Other specified postprocedural states: Z98.890

## 2021-04-12 HISTORY — DX: Other complications of anesthesia, initial encounter: T88.59XA

## 2021-04-12 SURGERY — BREAST LUMPECTOMY WITH SENTINEL LYMPH NODE BX
Anesthesia: General | Site: Breast | Laterality: Right

## 2021-04-12 MED ORDER — MIDAZOLAM HCL 2 MG/2ML IJ SOLN
2.0000 mg | Freq: Once | INTRAMUSCULAR | Status: AC
  Administered 2021-04-12: 2 mg via INTRAVENOUS

## 2021-04-12 MED ORDER — FENTANYL CITRATE (PF) 100 MCG/2ML IJ SOLN: 25.0000 ug | INTRAMUSCULAR | Status: DC | PRN

## 2021-04-12 MED ORDER — SCOPOLAMINE 1 MG/3DAYS TD PT72
MEDICATED_PATCH | TRANSDERMAL | Status: AC
  Filled 2021-04-12: qty 1

## 2021-04-12 MED ORDER — ONDANSETRON HCL 4 MG/2ML IJ SOLN: 4.0000 mg | Freq: Once | INTRAMUSCULAR | Status: DC | PRN

## 2021-04-12 MED ORDER — CLONIDINE HCL (ANALGESIA) 100 MCG/ML EP SOLN
EPIDURAL | Status: DC | PRN
  Administered 2021-04-12: 100 ug

## 2021-04-12 MED ORDER — ACETAMINOPHEN 500 MG PO TABS
1000.0000 mg | ORAL_TABLET | ORAL | Status: AC
  Administered 2021-04-12: 1000 mg via ORAL

## 2021-04-12 MED ORDER — EPHEDRINE SULFATE 50 MG/ML IJ SOLN
INTRAMUSCULAR | Status: DC | PRN
  Administered 2021-04-12: 25 mg via INTRAVENOUS

## 2021-04-12 MED ORDER — PHENYLEPHRINE 40 MCG/ML (10ML) SYRINGE FOR IV PUSH (FOR BLOOD PRESSURE SUPPORT)
PREFILLED_SYRINGE | INTRAVENOUS | Status: DC | PRN
  Administered 2021-04-12: 120 ug via INTRAVENOUS

## 2021-04-12 MED ORDER — DEXAMETHASONE SODIUM PHOSPHATE 10 MG/ML IJ SOLN
INTRAMUSCULAR | Status: DC | PRN
  Administered 2021-04-12: 10 mg

## 2021-04-12 MED ORDER — FENTANYL CITRATE (PF) 100 MCG/2ML IJ SOLN
INTRAMUSCULAR | Status: AC
  Filled 2021-04-12: qty 2

## 2021-04-12 MED ORDER — BUPIVACAINE HCL (PF) 0.25 % IJ SOLN
INTRAMUSCULAR | Status: AC
  Filled 2021-04-12: qty 30

## 2021-04-12 MED ORDER — PROPOFOL 10 MG/ML IV BOLUS
INTRAVENOUS | Status: DC | PRN
  Administered 2021-04-12: 140 mg via INTRAVENOUS

## 2021-04-12 MED ORDER — LIDOCAINE-EPINEPHRINE (PF) 1 %-1:200000 IJ SOLN
INTRAMUSCULAR | Status: DC | PRN
  Administered 2021-04-12: 10 mL via INTRAMUSCULAR

## 2021-04-12 MED ORDER — ACETAMINOPHEN 160 MG/5ML PO SOLN
325.0000 mg | ORAL | Status: DC | PRN
Start: 2021-04-12 — End: 2021-04-12

## 2021-04-12 MED ORDER — ACETAMINOPHEN 325 MG PO TABS: 325.0000 mg | ORAL_TABLET | ORAL | Status: DC | PRN

## 2021-04-12 MED ORDER — MEPERIDINE HCL 25 MG/ML IJ SOLN: 6.2500 mg | INTRAMUSCULAR | Status: DC | PRN

## 2021-04-12 MED ORDER — CEFAZOLIN SODIUM-DEXTROSE 2-4 GM/100ML-% IV SOLN
INTRAVENOUS | Status: AC
  Filled 2021-04-12: qty 100

## 2021-04-12 MED ORDER — METHYLENE BLUE 0.5 % INJ SOLN
INTRAVENOUS | Status: AC
  Filled 2021-04-12: qty 10

## 2021-04-12 MED ORDER — ONDANSETRON HCL 4 MG/2ML IJ SOLN
INTRAMUSCULAR | Status: DC | PRN
  Administered 2021-04-12: 4 mg via INTRAVENOUS

## 2021-04-12 MED ORDER — OXYCODONE HCL 5 MG PO TABS: 2.5000 mg | ORAL_TABLET | Freq: Four times a day (QID) | ORAL | 0 refills | Status: DC | PRN

## 2021-04-12 MED ORDER — LACTATED RINGERS IV SOLN: INTRAVENOUS | Status: DC

## 2021-04-12 MED ORDER — FENTANYL CITRATE (PF) 100 MCG/2ML IJ SOLN
100.0000 ug | Freq: Once | INTRAMUSCULAR | Status: AC
  Administered 2021-04-12: 100 ug via INTRAVENOUS

## 2021-04-12 MED ORDER — SODIUM CHLORIDE (PF) 0.9 % IJ SOLN
INTRAMUSCULAR | Status: AC
  Filled 2021-04-12: qty 10

## 2021-04-12 MED ORDER — OXYCODONE HCL 5 MG/5ML PO SOLN: 5.0000 mg | Freq: Once | ORAL | Status: DC | PRN

## 2021-04-12 MED ORDER — KETOROLAC TROMETHAMINE 15 MG/ML IJ SOLN: 15.0000 mg | Freq: Once | INTRAMUSCULAR | Status: DC

## 2021-04-12 MED ORDER — OXYCODONE HCL 5 MG PO TABS: 5.0000 mg | ORAL_TABLET | Freq: Once | ORAL | Status: DC | PRN

## 2021-04-12 MED ORDER — LIDOCAINE-EPINEPHRINE (PF) 1 %-1:200000 IJ SOLN
INTRAMUSCULAR | Status: AC
  Filled 2021-04-12: qty 30

## 2021-04-12 MED ORDER — CHLORHEXIDINE GLUCONATE CLOTH 2 % EX PADS: 6.0000 | MEDICATED_PAD | Freq: Once | CUTANEOUS | Status: DC

## 2021-04-12 MED ORDER — SCOPOLAMINE 1 MG/3DAYS TD PT72
1.0000 | MEDICATED_PATCH | TRANSDERMAL | Status: DC
  Administered 2021-04-12: 1.5 mg via TRANSDERMAL

## 2021-04-12 MED ORDER — DEXAMETHASONE SODIUM PHOSPHATE 4 MG/ML IJ SOLN
4.0000 mg | INTRAMUSCULAR | Status: AC
  Administered 2021-04-12: 8 mg via INTRAVENOUS

## 2021-04-12 MED ORDER — ACETAMINOPHEN 500 MG PO TABS
ORAL_TABLET | ORAL | Status: AC
  Filled 2021-04-12: qty 2

## 2021-04-12 MED ORDER — CEFAZOLIN SODIUM-DEXTROSE 2-4 GM/100ML-% IV SOLN
2.0000 g | INTRAVENOUS | Status: AC
  Administered 2021-04-12: 2 g via INTRAVENOUS

## 2021-04-12 MED ORDER — BUPIVACAINE-EPINEPHRINE (PF) 0.5% -1:200000 IJ SOLN
INTRAMUSCULAR | Status: DC | PRN
  Administered 2021-04-12 (×6): 5 mL via PERINEURAL

## 2021-04-12 MED ORDER — MIDAZOLAM HCL 2 MG/2ML IJ SOLN
INTRAMUSCULAR | Status: AC
  Filled 2021-04-12: qty 2

## 2021-04-12 MED ORDER — LIDOCAINE HCL (CARDIAC) PF 100 MG/5ML IV SOSY
PREFILLED_SYRINGE | INTRAVENOUS | Status: DC | PRN
  Administered 2021-04-12: 100 mg via INTRAVENOUS

## 2021-04-12 MED ORDER — TECHNETIUM TC 99M TILMANOCEPT KIT
1.0000 | PACK | Freq: Once | INTRAVENOUS | Status: AC | PRN
  Administered 2021-04-12: 1 via INTRADERMAL

## 2021-04-12 SURGICAL SUPPLY — 52 items
ADH SKN CLS APL DERMABOND .7 (GAUZE/BANDAGES/DRESSINGS) ×1
APL PRP STRL LF DISP 70% ISPRP (MISCELLANEOUS) ×1
BINDER BREAST MEDIUM (GAUZE/BANDAGES/DRESSINGS) ×2 IMPLANT
BLADE SURG 10 STRL SS (BLADE) ×2 IMPLANT
BLADE SURG 15 STRL LF DISP TIS (BLADE) ×1 IMPLANT
BLADE SURG 15 STRL SS (BLADE) ×2
BNDG GAUZE ELAST 4 BULKY (GAUZE/BANDAGES/DRESSINGS) ×2 IMPLANT
CANISTER SUCT 1200ML W/VALVE (MISCELLANEOUS) ×2 IMPLANT
CHLORAPREP W/TINT 26 (MISCELLANEOUS) ×2 IMPLANT
CLIP VESOCCLUDE LG 6/CT (CLIP) ×2 IMPLANT
CLIP VESOCCLUDE MED 6/CT (CLIP) ×2 IMPLANT
COVER MAYO STAND STRL (DRAPES) ×2 IMPLANT
COVER PROBE W GEL 5X96 (DRAPES) ×2 IMPLANT
DERMABOND ADVANCED (GAUZE/BANDAGES/DRESSINGS) ×1
DERMABOND ADVANCED .7 DNX12 (GAUZE/BANDAGES/DRESSINGS) ×1 IMPLANT
DRAPE UTILITY XL STRL (DRAPES) ×2 IMPLANT
DRSG PAD ABDOMINAL 8X10 ST (GAUZE/BANDAGES/DRESSINGS) ×2 IMPLANT
ELECT COATED BLADE 2.86 ST (ELECTRODE) ×2 IMPLANT
ELECT REM PT RETURN 9FT ADLT (ELECTROSURGICAL) ×2
ELECTRODE REM PT RTRN 9FT ADLT (ELECTROSURGICAL) ×1 IMPLANT
GAUZE SPONGE 4X4 12PLY STRL (GAUZE/BANDAGES/DRESSINGS) ×4 IMPLANT
GLOVE SURG ENC MOIS LTX SZ6 (GLOVE) ×2 IMPLANT
GLOVE SURG POLYISO LF SZ6.5 (GLOVE) ×2 IMPLANT
GLOVE SURG UNDER POLY LF SZ6.5 (GLOVE) ×4 IMPLANT
GLOVE SURG UNDER POLY LF SZ7 (GLOVE) ×2 IMPLANT
GOWN STRL REUS W/ TWL LRG LVL3 (GOWN DISPOSABLE) ×2 IMPLANT
GOWN STRL REUS W/TWL 2XL LVL3 (GOWN DISPOSABLE) ×2 IMPLANT
GOWN STRL REUS W/TWL LRG LVL3 (GOWN DISPOSABLE) ×4
KIT MARKER MARGIN INK (KITS) ×2 IMPLANT
LIGHT WAVEGUIDE WIDE FLAT (MISCELLANEOUS) ×2 IMPLANT
NDL SAFETY ECLIPSE 18X1.5 (NEEDLE) ×1 IMPLANT
NEEDLE HYPO 18GX1.5 SHARP (NEEDLE) ×2
NEEDLE HYPO 25X1 1.5 SAFETY (NEEDLE) ×4 IMPLANT
NS IRRIG 1000ML POUR BTL (IV SOLUTION) ×2 IMPLANT
PACK BASIN DAY SURGERY FS (CUSTOM PROCEDURE TRAY) ×2 IMPLANT
PACK UNIVERSAL I (CUSTOM PROCEDURE TRAY) ×2 IMPLANT
PENCIL SMOKE EVACUATOR (MISCELLANEOUS) ×2 IMPLANT
SLEEVE SCD COMPRESS KNEE MED (STOCKING) ×2 IMPLANT
SPONGE LAP 18X18 RF (DISPOSABLE) ×2 IMPLANT
STOCKINETTE IMPERVIOUS LG (DRAPES) ×2 IMPLANT
STRIP CLOSURE SKIN 1/2X4 (GAUZE/BANDAGES/DRESSINGS) ×2 IMPLANT
SUT MON AB 4-0 PC3 18 (SUTURE) ×2 IMPLANT
SUT SILK 2 0 SH (SUTURE) IMPLANT
SUT VIC AB 2-0 SH 27 (SUTURE) ×2
SUT VIC AB 2-0 SH 27XBRD (SUTURE) ×1 IMPLANT
SUT VIC AB 3-0 SH 27 (SUTURE) ×6
SUT VIC AB 3-0 SH 27X BRD (SUTURE) ×3 IMPLANT
SYR BULB EAR ULCER 3OZ GRN STR (SYRINGE) IMPLANT
SYR CONTROL 10ML LL (SYRINGE) ×4 IMPLANT
TOWEL GREEN STERILE FF (TOWEL DISPOSABLE) ×2 IMPLANT
TUBE CONNECTING 20X1/4 (TUBING) ×2 IMPLANT
YANKAUER SUCT BULB TIP NO VENT (SUCTIONS) ×2 IMPLANT

## 2021-04-12 NOTE — Anesthesia Procedure Notes (Signed)
Anesthesia Regional Block: Pectoralis block   Pre-Anesthetic Checklist: , timeout performed,  Correct Patient, Correct Site, Correct Laterality,  Correct Procedure, Correct Position, site marked,  Risks and benefits discussed,  Surgical consent,  Pre-op evaluation,  At surgeon's request and post-op pain management  Laterality: Right and N/A  Prep: chloraprep       Needles:  Injection technique: Single-shot  Needle Type: Echogenic Stimulator Needle     Needle Length: 9cm  Needle Gauge: 20   Needle insertion depth: 1 cm   Additional Needles:   Procedures:,,,, ultrasound used (permanent image in chart),,    Narrative:  Start time: 04/12/2021 8:10 AM End time: 04/12/2021 8:19 AM Injection made incrementally with aspirations every 5 mL.  Performed by: Personally  Anesthesiologist: Lyn Hollingshead, MD

## 2021-04-12 NOTE — Anesthesia Procedure Notes (Signed)
Procedure Name: LMA Insertion Date/Time: 04/12/2021 9:13 AM Performed by: Tawni Millers, CRNA Pre-anesthesia Checklist: Patient identified, Emergency Drugs available, Suction available and Patient being monitored Patient Re-evaluated:Patient Re-evaluated prior to induction Oxygen Delivery Method: Circle system utilized Preoxygenation: Pre-oxygenation with 100% oxygen Induction Type: IV induction Ventilation: Mask ventilation without difficulty LMA: LMA inserted LMA Size: 3.0 Number of attempts: 1 Airway Equipment and Method: Bite block Placement Confirmation: positive ETCO2 Tube secured with: Tape Dental Injury: Teeth and Oropharynx as per pre-operative assessment

## 2021-04-12 NOTE — Op Note (Signed)
Right Breast Lumpectomy with Sentinel Node Mapping and Biopsy Procedure Note  Indications: This patient presents with history of right breast cancer with clinically negative axillary lymph node exam.  Pre-operative Diagnosis: right breast cancer, upper outer quadrant, grade 2 invasive ductal carcinoma , ER+/PR+/Her 2-, cT2N0  Post-operative Diagnosis: right breast cancer  Surgeon: Stark Klein   Assistants: n/a  Anesthesia: general anesthesia and pectoral block  ASA Class: 2  Procedure Details  The patient was seen in the Holding Room. The risks, benefits, complications, treatment options, and expected outcomes were discussed with the patient. The possibilities of reaction to medication, pulmonary aspiration, bleeding, infection, the need for additional procedures, failure to diagnose a condition, and creating a complication requiring transfusion or operation were discussed with the patient. The patient concurred with the proposed plan, giving informed consent.  The site of surgery properly noted/marked. The patient was taken to Operating Room # 2, identified as Shannon Foster and the procedure verified as Right Breast Lumpectomy and Sentinel Node Biopsy. A Time Out was held and the above information confirmed.   After induction of anesthesia, the right arm, breast, and chest were prepped and draped in standard fashion.  The lumpectomy was performed by creating a lateral incision near the mass.  Skin hooks were used to elevate the skin and the cautery was used to dissect around the mass.  Dissection was carried down to the pectoral fascia.  The specimen was marked with the margin marker paint kit.  The specimen was placed in the faxatron to confirm presence on the biopsy clip.  Hemostasis was achieved with cautery.  Large clips were placed on the specimen cavity edges for radiation.  The surrounding breast tissue was mobilized from the skin in order to create flaps of breast tissue  minimize the defect.  Mastopexy was performed by pulling some of this tissue over the defect to cover.  The breast tissue was closed with 2-0 vicryl interrupted suture.  The wound was irrigated and closed with a 3-0 Vicryl deep dermal interrupted and a 4-0 Monocryl subcuticular closure in layers.  Using a hand-held gamma probe, axillary sentinel nodes were identified transcutaneously.  An oblique incision was created below the axillary hairline.  Dissection was carried through the clavipectoral fascia.   Two deep  level 2 axillary sentinel nodes were removed. Lymphovascular channels were clipped.  The wound was irrigated.  Hemostasis was achieved with cautery and clips.  The axillary incision was closed with 3-0 vicryl deep dermal interrupted sutures and 4-0 monocryl subcuticular closure in layers.      Sterile dressings were applied. At the end of the operation, all sponge, instrument, and needle counts were correct.  Findings: Grossly clear surgical margins without invasion into the skin.  The skin was the anterior margin and the pectoralis was the posterior margin.  SLN #1 cps 5305; SLN #2 cps 1560, background 54  Estimated Blood Loss:  Minimal         Specimens: right breast tissue, right axillary SLN #1 and #2                Complications:  None; patient tolerated the procedure well.         Disposition: PACU - hemodynamically stable.         Condition: stable

## 2021-04-12 NOTE — Progress Notes (Signed)
Emotional support provided through nuclear medicine breast injections. Vital signs stable. Patient tolerated well.  

## 2021-04-12 NOTE — Discharge Instructions (Addendum)
[place] Office Phone Number [phone]  BREAST BIOPSY/ PARTIAL MASTECTOMY: POST OP INSTRUCTIONS  Always review your discharge instruction sheet given to you by the facility where your surgery was performed.  IF YOU HAVE DISABILITY OR FAMILY LEAVE FORMS, YOU MUST BRING THEM TO THE OFFICE FOR PROCESSING.  DO NOT GIVE THEM TO YOUR DOCTOR.  A prescription for pain medication may be given to you upon discharge.  Take your pain medication as prescribed, if needed.  If narcotic pain medicine is not needed, then you may take acetaminophen (Tylenol) or ibuprofen (Advil) as needed. Take your usually prescribed medications unless otherwise directed If you need a refill on your pain medication, please contact your pharmacy.  They will contact our office to request authorization.  Prescriptions will not be filled after 5pm or on week-ends. You should eat very light the first 24 hours after surgery, such as soup, crackers, pudding, etc.  Resume your normal diet the day after surgery. Most patients will experience some swelling and bruising in the breast.  Ice packs and a good support bra will help.  Swelling and bruising can take several days to resolve.  It is common to experience some constipation if taking pain medication after surgery.  Increasing fluid intake and taking a stool softener will usually help or prevent this problem from occurring.  A mild laxative (Milk of Magnesia or Miralax) should be taken according to package directions if there are no bowel movements after 48 hours. Unless discharge instructions indicate otherwise, you may remove your bandages 48 hours after surgery, and you may shower at that time.  You may have steri-strips (small skin tapes) in place directly over the incision.  These strips should be left on the skin for 7-10 days.   Any sutures or staples will be removed at the office during your follow-up visit. ACTIVITIES:  You may resume regular daily activities  (gradually increasing) beginning the next day.  Wearing a good support bra or sports bra (or the breast binder) minimizes pain and swelling.  You may have sexual intercourse when it is comfortable. You may drive when you no longer are taking prescription pain medication, you can comfortably wear a seatbelt, and you can safely maneuver your car and apply brakes. RETURN TO WORK:  __________1 week_______________ Dennis Bast should see your doctor in the office for a follow-up appointment approximately two weeks after your surgery.  Your doctor's nurse will typically make your follow-up appointment when she calls you with your pathology report.  Expect your pathology report 2-3 business days after your surgery.  You may call to check if you do not hear from [place] after three days.   WHEN TO CALL YOUR DOCTOR: Fever over 101.0 Nausea and/or vomiting. Extreme swelling or bruising. Continued bleeding from incision. Increased pain, redness, or drainage from the incision.  The clinic staff is available to answer your questions during regular business hours.  Please don't hesitate to call and ask to speak to one of the nurses for clinical concerns.  If you have a medical emergency, go to the nearest emergency room or call 911.  A surgeon from [facility] is always on call at the hospital.  For further questions, please visit [email or web address]   No Tylenol until 1:22 pm   Post Anesthesia Home Care Instructions  Activity: Get plenty of rest for the remainder of the day. A responsible individual must stay with you for 24 hours following the procedure.  For the next 24 hours,  DO NOT: -Drive a car -Paediatric nurse -Drink alcoholic beverages -Take any medication unless instructed by your physician -Make any legal decisions or sign important papers.  Meals: Start with liquid foods such as gelatin or soup. Progress to regular foods as tolerated. Avoid greasy, spicy, heavy foods. If nausea  and/or vomiting occur, drink only clear liquids until the nausea and/or vomiting subsides. Call your physician if vomiting continues.  Special Instructions/Symptoms: Your throat may feel dry or sore from the anesthesia or the breathing tube placed in your throat during surgery. If this causes discomfort, gargle with warm salt water. The discomfort should disappear within 24 hours.  If you had a scopolamine patch placed behind your ear for the management of post- operative nausea and/or vomiting:  1. The medication in the patch is effective for 72 hours, after which it should be removed.  Wrap patch in a tissue and discard in the trash. Wash hands thoroughly with soap and water. 2. You may remove the patch earlier than 72 hours if you experience unpleasant side effects which may include dry mouth, dizziness or visual disturbances. 3. Avoid touching the patch. Wash your hands with soap and water after contact with the patch.

## 2021-04-12 NOTE — Anesthesia Preprocedure Evaluation (Signed)
Anesthesia Evaluation  Patient identified by MRN, date of birth, ID band Patient awake    Reviewed: Allergy & Precautions, NPO status , Patient's Chart, lab work & pertinent test results  History of Anesthesia Complications (+) PONV and history of anesthetic complications  Airway Mallampati: I       Dental no notable dental hx.    Pulmonary neg pulmonary ROS,    Pulmonary exam normal        Cardiovascular negative cardio ROS Normal cardiovascular exam     Neuro/Psych  Headaches,    GI/Hepatic negative GI ROS, Neg liver ROS,   Endo/Other  negative endocrine ROS  Renal/GU negative Renal ROS  negative genitourinary   Musculoskeletal   Abdominal Normal abdominal exam  (+)   Peds  Hematology negative hematology ROS (+)   Anesthesia Other Findings   Reproductive/Obstetrics                             Anesthesia Physical Anesthesia Plan  ASA: 2  Anesthesia Plan: General   Post-op Pain Management:  Regional for Post-op pain   Induction: Intravenous  PONV Risk Score and Plan: 4 or greater and Ondansetron, Dexamethasone, Midazolam and Scopolamine patch - Pre-op  Airway Management Planned: LMA  Additional Equipment: None  Intra-op Plan:   Post-operative Plan: Extubation in OR  Informed Consent: I have reviewed the patients History and Physical, chart, labs and discussed the procedure including the risks, benefits and alternatives for the proposed anesthesia with the patient or authorized representative who has indicated his/her understanding and acceptance.     Dental advisory given  Plan Discussed with: CRNA  Anesthesia Plan Comments:         Anesthesia Quick Evaluation

## 2021-04-12 NOTE — Anesthesia Postprocedure Evaluation (Signed)
Anesthesia Post Note  Patient: Shannon Foster  Procedure(s) Performed: RIGHT BREAST LUMPECTOMY WITH SENTINEL LYMPH NODE BX (Right: Breast)     Patient location during evaluation: PACU Anesthesia Type: General Level of consciousness: awake and sedated Pain management: pain level controlled Vital Signs Assessment: post-procedure vital signs reviewed and stable Respiratory status: spontaneous breathing Cardiovascular status: stable Postop Assessment: no apparent nausea or vomiting Anesthetic complications: no   No notable events documented.  Last Vitals:  Vitals:   04/12/21 1115 04/12/21 1130  BP: 101/67 107/66  Pulse: 73 70  Resp: 18 17  Temp:    SpO2: 100% 100%    Last Pain:  Vitals:   04/12/21 1130  TempSrc:   PainSc: 0-No pain                 Huston Foley

## 2021-04-12 NOTE — Transfer of Care (Signed)
Immediate Anesthesia Transfer of Care Note  Patient: Shannon Foster  Procedure(s) Performed: RIGHT BREAST LUMPECTOMY WITH SENTINEL LYMPH NODE BX (Right: Breast)  Patient Location: PACU  Anesthesia Type:GA combined with regional for post-op pain  Level of Consciousness: sedated  Airway & Oxygen Therapy: Patient Spontanous Breathing and Patient connected to face mask oxygen  Post-op Assessment: Report given to RN and Post -op Vital signs reviewed and stable  Post vital signs: Reviewed and stable  Last Vitals:  Vitals Value Taken Time  BP 87/59 04/12/21 1040  Temp    Pulse 73 04/12/21 1042  Resp 14 04/12/21 1042  SpO2 99 % 04/12/21 1042  Vitals shown include unvalidated device data.  Last Pain:  Vitals:   04/12/21 0715  TempSrc: Oral  PainSc: 4       Patients Stated Pain Goal: 2 (12/27/47 9692)  Complications: No notable events documented.

## 2021-04-12 NOTE — Interval H&P Note (Signed)
History and Physical Interval Note:  04/12/2021 8:44 AM  Shannon Foster  has presented today for surgery, with the diagnosis of RIGHT BREAST CANCER.  The various methods of treatment have been discussed with the patient and family. After consideration of risks, benefits and other options for treatment, the patient has consented to  Procedure(s): RIGHT BREAST LUMPECTOMY WITH SENTINEL LYMPH NODE BX (Right) as a surgical intervention.  The patient's history has been reviewed, patient examined, no change in status, stable for surgery.  I have reviewed the patient's chart and labs.  Questions were answered to the patient's satisfaction.     Stark Klein

## 2021-04-12 NOTE — Progress Notes (Signed)
Assisted Dr. Jillyn Hidden with right, ultrasound guided, pectoralis block. Side rails up, monitors on throughout procedure. See vital signs in flow sheet. Tolerated Procedure well.

## 2021-04-15 ENCOUNTER — Encounter (HOSPITAL_BASED_OUTPATIENT_CLINIC_OR_DEPARTMENT_OTHER): Payer: Self-pay | Admitting: General Surgery

## 2021-04-16 ENCOUNTER — Telehealth: Payer: Self-pay | Admitting: *Deleted

## 2021-04-16 ENCOUNTER — Encounter: Payer: Self-pay | Admitting: *Deleted

## 2021-04-16 LAB — SURGICAL PATHOLOGY

## 2021-04-16 NOTE — Telephone Encounter (Signed)
Received order for mammaprint testing. Requisition faxed to pathology and [facility]

## 2021-04-18 ENCOUNTER — Telehealth: Payer: Self-pay | Admitting: General Surgery

## 2021-04-18 DIAGNOSIS — C50411 Malignant neoplasm of upper-outer quadrant of right female breast: Secondary | ICD-10-CM | POA: Diagnosis not present

## 2021-04-18 NOTE — Progress Notes (Signed)
Patient Care Team: Inda Coke, [place] as PCP - General (Physician Assistant) Mauro Kaufmann, RN as Oncology Nurse Navigator Rockwell Germany, RN as Oncology Nurse Navigator Stark Klein, MD as Consulting Physician (General Surgery) Nicholas Lose, MD as Consulting Physician (Hematology and Oncology) Eppie Gibson, MD as Attending Physician (Radiation Oncology)  DIAGNOSIS:    ICD-10-CM   1. Malignant neoplasm of upper-outer quadrant of right breast in female, estrogen receptor positive ([place])  C50.411    Z17.0       SUMMARY OF ONCOLOGIC HISTORY: Oncology History  Malignant neoplasm of upper-outer quadrant of right breast in female, estrogen receptor positive ([place])  03/13/2021 Initial Diagnosis   Palpable lump for 1 month, D density breast: 3.3 cm mass, axilla negative, incidental mass at 4:00: Fibrocystic change, biopsy revealed grade 2 IDC with perineural invasion, ER 90%, PR 95%, Ki-67 10%, HER2 negative by [facility]   03/14/2021 Cancer Staging   Staging form: Breast, AJCC 8th Edition - Clinical stage from 03/14/2021: Stage IB (cT2, cN0, cM0, G2, ER+, PR+, HER2-) - Signed by Nicholas Lose, MD on 03/14/2021  Stage prefix: Initial diagnosis  Histologic grading system: 3 grade system    04/12/2021 Surgery   Right lumpectomy: Grade 2 IDC 2.9 cm, margins negative, 1/2 lymph nodes positive, ER 90%, PR 95%, HER2 equivocal by IHC negative by FISH ratio 1.47, Ki-67 10%     CHIEF COMPLIANT: Follow-up for right breast cancer  INTERVAL HISTORY: Shannon Foster is a 60 y.o. with above-mentioned history of right breast cancer. 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 She reports to the clinic today for follow-up and to discuss the pathology report.  She is healing and recovering very well from the recent surgery. She has a lot of pain from the surgery.  She does not want to take any pain medication  and she is taking some ibuprofen.  She is able to use her arm but has been instructed not to lift weights.  She is currently on unpaid time off and wants to know when she can return to work.  ALLERGIES:  is allergic to codeine.  MEDICATIONS:  Current Outpatient Medications  Medication Sig Dispense Refill   Ascorbic Acid (VITAMIN C) 1000 MG tablet Take 1,000 mg by mouth daily.     aspirin EC 81 MG tablet Take 81 mg by mouth daily. Swallow whole.     cholecalciferol (VITAMIN D) 1000 units tablet Take 1,000 Units by mouth daily.     FINACEA 15 % cream APPLY GEL ONCE OR TWICE DAILY TO FACE.  2   fluticasone (FLONASE) 50 MCG/ACT nasal spray 2 sprays by Each Nare route 2 times daily.     Lysine HCl 500 MG TABS Take by mouth.     niacinamide 500 MG tablet Take 500 mg by mouth 2 (two) times daily with a meal.     Omega-3 Fatty Acids (FISH OIL) 1000 MG CAPS Take by mouth.     oxyCODONE (OXY IR/ROXICODONE) 5 MG immediate release tablet Take 0.5-1 tablets (2.5-5 mg total) by mouth every 6 (six) hours as needed for severe pain. 10 tablet 0   Potassium Gluconate 550 (90 K) MG TABS Take by mouth.     rizatriptan (MAXALT) 5 MG tablet TAKE 1 TABLET BY MOUTH AS NEEDED FOR MIGRAINE. MAY REPEAT IN 2 HOURS IF NEEDED 10 tablet 2   No current facility-administered medications for this visit.  PHYSICAL EXAMINATION: ECOG PERFORMANCE STATUS: 1 - Symptomatic but completely ambulatory  Vitals:   04/19/21 1506  BP: 124/82  Pulse: 99  Temp: 97.7 F (36.5 C)  SpO2: 100%   Filed Weights   04/19/21 1506  Weight: 107 lb 12.8 oz (48.9 kg)      LABORATORY DATA:  I have reviewed the data as listed CMP Latest Ref Rng & Units 03/14/2021 10/12/2019 10/30/2017  Glucose 70 - 99 mg/dL 104(H) 70 89  BUN 8 - 23 mg/dL 18 19 13   Creatinine 0.44 - 1.00 mg/dL 0.77 0.68 0.80  Sodium 135 - 145 mmol/L 144 141 140  Potassium 3.5 - 5.1 mmol/L 3.7 3.6 3.6  Chloride 98 - 111 mmol/L 105 106 110  CO2 22 - 32 mmol/L 29 30 26    Calcium 8.9 - 10.3 mg/dL 9.1 8.8 8.3(L)  Total Protein 6.5 - 8.1 g/dL 7.0 6.2 6.2(L)  Total Bilirubin 0.3 - 1.2 mg/dL 0.3 0.3 0.5  Alkaline Phos 38 - 126 U/L 74 72 54  AST 15 - 41 U/L 26 21 21   ALT 0 - 44 U/L 21 17 18     Lab Results  Component Value Date   WBC 5.1 03/14/2021   HGB 15.0 03/14/2021   HCT 43.6 03/14/2021   MCV 90.8 03/14/2021   PLT 256 03/14/2021   NEUTROABS 3.5 03/14/2021    ASSESSMENT & PLAN:  Malignant neoplasm of upper-outer quadrant of right breast in female, estrogen receptor positive (HCC) Palpable lump for 1 month, D density breast: 3.3 cm mass, axilla negative, incidental mass at 4:00: Fibrocystic change, biopsy revealed grade 2 IDC with perineural invasion, ER 90%, PR 95%, Ki-67 10%, HER2 negative by FISH  Recommendations: 1. Breast conserving surgery 04/12/21: Grade 2 IDC 2.6 cm, 1/2 LN positive, Margins Neg, ER 90%, PR 95%, Ki-67 10%, HER2 negative by FISH 2.Mammaprint testing to determine if chemotherapy would be of any benefit followed by 3. Adjuvant radiation therapy followed by 4. Adjuvant antiestrogen therapy --------------------------------------------------------------------------------------------------------------------- Pathology counseling: I discussed the final pathology report of the patient provided  a copy of this report. I discussed the margins as well as lymph node surgeries. We also discussed the final staging along with previously performed ER/PR and HER-2/neu testing.   Mammaprint counseling: MINDACT is a prospective, randomized phase III controlled trial that investigates the clinical utility of MammaPrint, when compared to standard clinical pathological criteria, with 6,693 patients enrolled from over 111 institutions. Clinical high-risk patients with a Low Risk MammaPrint result, including 48% node-positive, had 5-year distant metastasis-free survival rate in excess of 94 percent, whether randomized to receive adjuvant chemotherapy or  not proving MammaPrint's ability to safely identify Low Risk patients.  RTC based on mammaprint test result    No orders of the defined types were placed in this encounter.  The patient has a good understanding of the overall plan. she agrees with it. she will call with any problems that may develop before the next visit here.  Total time spent: 30 mins including face to face time and time spent for planning, charting and coordination of care  Rulon Eisenmenger, MD, MPH 04/19/2021  I, Thana Ates, am acting as scribe for Dr. Nicholas Lose.  I have reviewed the above documentation for accuracy and completeness, and I agree with the above.

## 2021-04-18 NOTE — Telephone Encounter (Signed)
Left message.   Pt has follow up with Dr. Lindi Adie tomorrow.  ? Mammaprint to see if chemo recommended.

## 2021-04-18 NOTE — Assessment & Plan Note (Signed)
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 Breast conserving surgery 04/12/21: Grade 2 IDC 2.6 cm, 1/2 LN positive, Margins Neg, ER 90%, PR 95%, Ki-67 10%, HER2 negative by FISH 2.Mammaprint testing to determine if chemotherapy would be of any benefit followed by 3. Adjuvant radiation therapy followed by 4. Adjuvant antiestrogen therapy --------------------------------------------------------------------------------------------------------------------- Pathology counseling: I discussed the final pathology report of the patient provided  a copy of this report. I discussed the margins as well as lymph node surgeries. We also discussed the final staging along with previously performed ER/PR and HER-2/neu testing.   Mammaprint counseling: MINDACT is a prospective, randomized phase III controlled trial that investigates the clinical utility of MammaPrint, when compared to standard clinical pathological criteria, with 6,693 patients enrolled from over 111 institutions. Clinical high-risk patients with a Low Risk MammaPrint result, including 48% node-positive, had 5-year distant metastasis-free survival rate in excess of 94 percent, whether randomized to receive adjuvant chemotherapy or not proving MammaPrint's ability to safely identify Low Risk patients.  RTC based on mammaprint test result

## 2021-04-19 ENCOUNTER — Other Ambulatory Visit: Payer: Self-pay

## 2021-04-19 ENCOUNTER — Inpatient Hospital Stay: Payer: BC Managed Care – PPO | Attending: Hematology and Oncology | Admitting: Hematology and Oncology

## 2021-04-19 DIAGNOSIS — C50411 Malignant neoplasm of upper-outer quadrant of right female breast: Secondary | ICD-10-CM | POA: Insufficient documentation

## 2021-04-19 DIAGNOSIS — C773 Secondary and unspecified malignant neoplasm of axilla and upper limb lymph nodes: Secondary | ICD-10-CM | POA: Diagnosis not present

## 2021-04-19 DIAGNOSIS — Z170 Estrogen receptor positive status [ER+]: Secondary | ICD-10-CM | POA: Diagnosis not present

## 2021-04-26 ENCOUNTER — Encounter: Payer: Self-pay | Admitting: *Deleted

## 2021-04-26 ENCOUNTER — Telehealth: Payer: Self-pay | Admitting: *Deleted

## 2021-04-26 DIAGNOSIS — Z170 Estrogen receptor positive status [ER+]: Secondary | ICD-10-CM

## 2021-04-26 DIAGNOSIS — C50411 Malignant neoplasm of upper-outer quadrant of right female breast: Secondary | ICD-10-CM

## 2021-04-26 NOTE — Telephone Encounter (Signed)
Received mammaprint results of low risk. Left message for a return phone call. Referral placed for Dr. Isidore Moos

## 2021-04-27 ENCOUNTER — Telehealth: Payer: Self-pay | Admitting: *Deleted

## 2021-04-27 ENCOUNTER — Encounter: Payer: Self-pay | Admitting: Hematology and Oncology

## 2021-04-27 NOTE — Telephone Encounter (Signed)
Received VM from pt.  RN attempt x1 to return call, no answer. LVM to return call to the office.

## 2021-05-01 ENCOUNTER — Other Ambulatory Visit: Payer: Self-pay | Admitting: Radiation Oncology

## 2021-05-01 ENCOUNTER — Inpatient Hospital Stay
Admission: RE | Admit: 2021-05-01 | Discharge: 2021-05-01 | Disposition: A | Discharge status: Home / Self Care | Payer: Self-pay | Source: Ambulatory Visit | Attending: Radiation Oncology | Admitting: Radiation Oncology

## 2021-05-01 DIAGNOSIS — Z170 Estrogen receptor positive status [ER+]: Secondary | ICD-10-CM

## 2021-05-01 DIAGNOSIS — C50411 Malignant neoplasm of upper-outer quadrant of right female breast: Secondary | ICD-10-CM

## 2021-05-03 ENCOUNTER — Encounter (HOSPITAL_COMMUNITY): Payer: Self-pay

## 2021-05-07 ENCOUNTER — Encounter: Payer: Self-pay | Admitting: *Deleted

## 2021-05-10 NOTE — Progress Notes (Signed)
Location of Breast Cancer:right   FINAL MICROSCOPIC DIAGNOSIS:   A. BREAST, RIGHT, LUMPECTOMY:  - Invasive ductal carcinoma, 2.9 cm.  - Invasive carcinoma less than 0.1 cm from inferior margin.  - Invasive carcinoma 0.1 cm from posterior and anterior margins.  - Biopsy site and biopsy clip.  - See oncology table.   B. LYMPH NODE, RIGHT AXILLARY #1, SENTINEL, EXCISION:  - Metastatic carcinoma in one lymph node (1/1).  - Metastasis is 0.9 cm.   C. LYMPH NODE, RIGHT AXILLARY #2, SENTINEL, EXCISION:  - One lymph node negative for metastatic carcinoma (0/1).  Histology per Pathology Report: Ductal  Receptor Status: ER(90%), PR (95%), Her2-neu (neg), Ki-(10%)  Did patient present with symptoms (if so, please note symptoms) or was this found on screening mammography?: screening  Past/Anticipated interventions by surgeon, if FGI:LUYYY Lumpectomy has been preformed  Past/Anticipated interventions by medical oncology, if any: Chemotherapy depends on Mammoprint Recommendations: 1. Breast conserving surgery 04/12/21: Grade 2 IDC 2.6 cm, 1/2 LN positive, Margins Neg, ER 90%, PR 95%, Ki-67 10%, HER2 negative by FISH 2.Mammaprint testing to determine if chemotherapy would be of any benefit followed by 3. Adjuvant radiation therapy followed by 4. Adjuvant antiestrogen therapy  Lymphedema issues, if any:  no    Pain issues, if any:  none  SAFETY ISSUES: Prior radiation? no Pacemaker/ICD? no Possible current pregnancy? No--hysterectomy  Is the patient on methotrexate?no  Current Complaints / other details:  In for consult for right breast cancer. Has some aching to the breast but no pain. Follow with surgeon last week on Tuesday.     Ross Marcus, RN 05/10/2021,2:40 PM

## 2021-05-14 NOTE — Progress Notes (Signed)
Radiation Oncology         [phone]) [phone] ________________________________  Name: Shannon Foster MRN: [id number]  Date: 05/15/2021  DOB: 08-04-1961  Follow-Up Visit Note  Outpatient  CC: Shannon Foster, [place]  Shannon Lose, MD  Diagnosis:      ICD-10-CM   1. Malignant neoplasm of upper-outer quadrant of right breast in female, estrogen receptor positive (Shannon Foster)  C50.411    Z17.0       Cancer Staging Malignant neoplasm of upper-outer quadrant of right breast in female, estrogen receptor positive (Shannon Foster) Staging form: Breast, AJCC 8th Edition - Clinical stage from 03/14/2021: Stage IB (cT2, cN0, cM0, G2, ER+, PR+, HER2-) - Signed by Shannon Lose, MD on 03/14/2021 Stage prefix: Initial diagnosis Histologic grading system: 3 grade system  pT2, pN1a   CHIEF COMPLAINT: Here to discuss management of right breast cancer  Narrative:  The patient returns today for follow-up.     Since consultation date, the patient underwent right breast lumpectomy on 04/12/21 which revealed: invasive ductal carcinoma measuring 2.9 cm; less than 0.1 cm from the inferior margin and 0.1 cm from the posterior and anterior margins. Excision of right axillary lymph nodes revealed metastatic carcinoma in one of two lymph nodes measuring 0.9 cm.  Extranodal Extension: Present, microscopic. Prognostic indicators significant for; ER 90% positive, and PR 95% positive, both with strong staining intensity, Her2 negative, and Ki67 10%.   The patient received mammaprint testing; results revealed: low risk luminal-type A, with the patients average 10 year risk of recurrence untreated to be 10%, with a mammaprint index +0.082.  The patient followed up with Dr. Lindi Foster on 04/19/21. During this visit, the patient was noted to be recovering well from her recent lumpectomy; although symptomatically she reported having a lot of pain. She declined to take any prescription pain medication, only taking ibuprofen on occasion.  Pain  has improved since  Lymphedema issues, if any:  no    Pain issues, if any:  resolved  SAFETY ISSUES: Prior radiation? no Pacemaker/ICD? no Possible current pregnancy? No--hysterectomy  Is the patient on methotrexate?no     ALLERGIES:  is allergic to codeine.  Meds: Current Outpatient Medications  Medication Sig Dispense Refill   Ascorbic Acid (VITAMIN C) 1000 MG tablet Take 1,000 mg by mouth daily.     aspirin EC 81 MG tablet Take 81 mg by mouth daily. Swallow whole.     cholecalciferol (VITAMIN D) 1000 units tablet Take 1,000 Units by mouth daily.     FINACEA 15 % cream APPLY GEL ONCE OR TWICE DAILY TO FACE.  2   fluticasone (FLONASE) 50 MCG/ACT nasal spray 2 sprays by Each Nare route 2 times daily.     Lysine HCl 500 MG TABS Take by mouth.     niacinamide 500 MG tablet Take 500 mg by mouth 2 (two) times daily with a meal.     Omega-3 Fatty Acids (FISH OIL) 1000 MG CAPS Take by mouth.     Potassium Gluconate 550 (90 K) MG TABS Take by mouth.     rizatriptan (MAXALT) 5 MG tablet TAKE 1 TABLET BY MOUTH AS NEEDED FOR MIGRAINE. MAY REPEAT IN 2 HOURS IF NEEDED 10 tablet 2   oxyCODONE (OXY IR/ROXICODONE) 5 MG immediate release tablet Take 0.5-1 tablets (2.5-5 mg total) by mouth every 6 (six) hours as needed for severe pain. (Patient not taking: Reported on 05/15/2021) 10 tablet 0   No current facility-administered medications for this encounter.    Physical Findings:  height is 5' 5" (1.651 m) and weight is 106 lb 3.2 oz (48.2 kg). Her temperature is 97.6 F (36.4 C). Her blood pressure is 123/85 and her pulse is 97. Her respiration is 18 and oxygen saturation is 99%. .     General: Alert and oriented, in no acute distress Extremities: No cyanosis or edema. Musculoskeletal: symmetric strength and muscle tone throughout. Neurologic: No obvious focalities. Speech is fluent.  Psychiatric: Judgment and insight are intact. Affect is appropriate. Breast exam reveals satisfactory  healing of right axillary and lumpectomy scar  Lab Findings: Lab Results  Component Value Date   WBC 5.1 03/14/2021   HGB 15.0 03/14/2021   HCT 43.6 03/14/2021   MCV 90.8 03/14/2021   PLT 256 03/14/2021       Radiographic Findings: MRI breasts Mar 21 2021  IMPRESSION: 1. Irregular enhancing mass in the outer right breast measuring 2.2 x 2.0 x 2.7 cm consistent with biopsy proven malignancy. 2. There is mild enhancement at the biopsy site in the lower inner right breast, favored to represent post biopsy changes. 3. Possible abnormal lymph node in the right axilla with cortical thickness measuring 0.7 cm. Visualization of this area is somewhat limited. 4. No MRI evidence of malignancy in the left breast.  Impression/Plan: We discussed adjuvant radiotherapy today.  I recommend 6 weeks of adjuvant radiation therapy to the right breast and regional nodes in order to reduce risk of local regional recurrence by two thirds.  I reviewed the logistics, benefits, risks, and potential side effects of this treatment in detail. Risks may include but not necessary be limited to acute and late injury tissue in the radiation fields such as skin irritation (change in color/pigmentation, itching, dryness, pain, peeling). She may experience fatigue. We also discussed possible risk of long term cosmetic changes or scar tissue. There is also a smaller risk for lung toxicity, brachial plexopathy, lymphedema, musculoskeletal changes, rib fragility or induction of a second malignancy, late chronic non-healing soft tissue wound.    The patient asked good questions which I answered to her satisfaction. She is enthusiastic about proceeding with treatment. A consent form has been signed and placed in her chart.  She will proceed with treatment planning in 1 week.  On date of service, in total, I spent 20 minutes on this encounter. Patient was seen in person.  _____________________________________   Shannon Gibson, MD  This document serves as a record of services personally performed by Shannon Gibson, MD. It was created on her behalf by Shannon Foster, a trained medical scribe. The creation of this record is based on the scribe's personal observations and the provider's statements to them. This document has been checked and approved by the attending provider.

## 2021-05-15 ENCOUNTER — Ambulatory Visit
Admission: RE | Admit: 2021-05-15 | Discharge: 2021-05-15 | Disposition: A | Discharge status: Home / Self Care | Payer: BC Managed Care – PPO | Source: Ambulatory Visit | Attending: Radiation Oncology | Admitting: Radiation Oncology

## 2021-05-15 ENCOUNTER — Encounter: Payer: Self-pay | Admitting: Radiation Oncology

## 2021-05-15 ENCOUNTER — Other Ambulatory Visit: Payer: Self-pay

## 2021-05-15 VITALS — BP 123/85 | HR 97 | Temp 97.6°F | Resp 18 | Ht 65.0 in | Wt 106.2 lb

## 2021-05-15 DIAGNOSIS — Z79899 Other long term (current) drug therapy: Secondary | ICD-10-CM | POA: Insufficient documentation

## 2021-05-15 DIAGNOSIS — Z170 Estrogen receptor positive status [ER+]: Secondary | ICD-10-CM | POA: Insufficient documentation

## 2021-05-15 DIAGNOSIS — Z7982 Long term (current) use of aspirin: Secondary | ICD-10-CM | POA: Diagnosis not present

## 2021-05-15 DIAGNOSIS — C50411 Malignant neoplasm of upper-outer quadrant of right female breast: Secondary | ICD-10-CM | POA: Diagnosis not present

## 2021-05-15 DIAGNOSIS — C773 Secondary and unspecified malignant neoplasm of axilla and upper limb lymph nodes: Secondary | ICD-10-CM | POA: Insufficient documentation

## 2021-05-16 ENCOUNTER — Encounter: Payer: Self-pay | Admitting: Radiation Oncology

## 2021-05-18 ENCOUNTER — Ambulatory Visit: Payer: BC Managed Care – PPO | Admitting: Gastroenterology

## 2021-05-22 ENCOUNTER — Other Ambulatory Visit: Payer: Self-pay

## 2021-05-22 ENCOUNTER — Ambulatory Visit
Admission: RE | Admit: 2021-05-22 | Discharge: 2021-05-22 | Disposition: A | Discharge status: Home / Self Care | Payer: BC Managed Care – PPO | Source: Ambulatory Visit | Attending: Radiation Oncology | Admitting: Radiation Oncology

## 2021-05-22 DIAGNOSIS — C50411 Malignant neoplasm of upper-outer quadrant of right female breast: Secondary | ICD-10-CM | POA: Diagnosis not present

## 2021-05-22 DIAGNOSIS — Z170 Estrogen receptor positive status [ER+]: Secondary | ICD-10-CM | POA: Insufficient documentation

## 2021-05-23 DIAGNOSIS — C50411 Malignant neoplasm of upper-outer quadrant of right female breast: Secondary | ICD-10-CM | POA: Diagnosis not present

## 2021-05-23 DIAGNOSIS — Z170 Estrogen receptor positive status [ER+]: Secondary | ICD-10-CM | POA: Diagnosis not present

## 2021-05-28 ENCOUNTER — Telehealth: Payer: Self-pay | Admitting: Hematology and Oncology

## 2021-05-28 ENCOUNTER — Encounter: Payer: Self-pay | Admitting: *Deleted

## 2021-05-28 NOTE — Telephone Encounter (Signed)
Scheduled appt per 8/1 sch msg. Pt aware.  

## 2021-05-30 ENCOUNTER — Ambulatory Visit
Admission: RE | Admit: 2021-05-30 | Discharge: 2021-05-30 | Disposition: A | Discharge status: Home / Self Care | Payer: BC Managed Care – PPO | Source: Ambulatory Visit | Attending: Radiation Oncology | Admitting: Radiation Oncology

## 2021-05-30 ENCOUNTER — Other Ambulatory Visit: Payer: Self-pay

## 2021-05-30 DIAGNOSIS — Z170 Estrogen receptor positive status [ER+]: Secondary | ICD-10-CM | POA: Insufficient documentation

## 2021-05-30 DIAGNOSIS — Z510 Encounter for antineoplastic radiation therapy: Secondary | ICD-10-CM | POA: Diagnosis not present

## 2021-05-30 DIAGNOSIS — C50411 Malignant neoplasm of upper-outer quadrant of right female breast: Secondary | ICD-10-CM | POA: Diagnosis not present

## 2021-05-31 ENCOUNTER — Ambulatory Visit
Admission: RE | Admit: 2021-05-31 | Discharge: 2021-05-31 | Disposition: A | Discharge status: Home / Self Care | Payer: BC Managed Care – PPO | Source: Ambulatory Visit | Attending: Radiation Oncology | Admitting: Radiation Oncology

## 2021-05-31 DIAGNOSIS — Z510 Encounter for antineoplastic radiation therapy: Secondary | ICD-10-CM | POA: Diagnosis not present

## 2021-05-31 DIAGNOSIS — C50411 Malignant neoplasm of upper-outer quadrant of right female breast: Secondary | ICD-10-CM | POA: Diagnosis not present

## 2021-05-31 DIAGNOSIS — Z170 Estrogen receptor positive status [ER+]: Secondary | ICD-10-CM | POA: Diagnosis not present

## 2021-06-01 ENCOUNTER — Other Ambulatory Visit: Payer: Self-pay

## 2021-06-01 ENCOUNTER — Ambulatory Visit
Admission: RE | Admit: 2021-06-01 | Discharge: 2021-06-01 | Disposition: A | Discharge status: Home / Self Care | Payer: BC Managed Care – PPO | Source: Ambulatory Visit | Attending: Radiation Oncology | Admitting: Radiation Oncology

## 2021-06-01 DIAGNOSIS — C50411 Malignant neoplasm of upper-outer quadrant of right female breast: Secondary | ICD-10-CM | POA: Diagnosis not present

## 2021-06-01 DIAGNOSIS — Z510 Encounter for antineoplastic radiation therapy: Secondary | ICD-10-CM | POA: Diagnosis not present

## 2021-06-01 DIAGNOSIS — Z170 Estrogen receptor positive status [ER+]: Secondary | ICD-10-CM | POA: Diagnosis not present

## 2021-06-04 ENCOUNTER — Ambulatory Visit
Admission: RE | Admit: 2021-06-04 | Discharge: 2021-06-04 | Disposition: A | Discharge status: Home / Self Care | Payer: BC Managed Care – PPO | Source: Ambulatory Visit | Attending: Radiation Oncology | Admitting: Radiation Oncology

## 2021-06-04 ENCOUNTER — Other Ambulatory Visit: Payer: Self-pay

## 2021-06-04 DIAGNOSIS — Z170 Estrogen receptor positive status [ER+]: Secondary | ICD-10-CM

## 2021-06-04 DIAGNOSIS — Z510 Encounter for antineoplastic radiation therapy: Secondary | ICD-10-CM | POA: Diagnosis not present

## 2021-06-04 DIAGNOSIS — C50411 Malignant neoplasm of upper-outer quadrant of right female breast: Secondary | ICD-10-CM

## 2021-06-04 MED ORDER — RADIAPLEXRX EX GEL
Freq: Once | CUTANEOUS | Status: AC
Start: 2021-06-04 — End: 2021-06-04

## 2021-06-04 NOTE — Progress Notes (Signed)
Pt here for patient teaching.    Pt given Radiation and You booklet and Radiaplex gel (she declined Alra deodorant)   Reviewed areas of pertinence such as fatigue, hair loss, skin changes, breast tenderness, and breast swelling .   Pt able to give teach back of to pat skin, use unscented/gentle soap, and drink plenty of water,apply Radiaplex bid, avoid applying anything to skin within 4 hours of treatment, avoid wearing an under wire bra, and to use an electric razor if they must shave.   Pt demonstrated understanding and verbalizes understanding of information given and will contact nursing with any questions or concerns.    [email or web address]

## 2021-06-05 ENCOUNTER — Ambulatory Visit
Admission: RE | Admit: 2021-06-05 | Discharge: 2021-06-05 | Disposition: A | Discharge status: Home / Self Care | Payer: BC Managed Care – PPO | Source: Ambulatory Visit | Attending: Radiation Oncology | Admitting: Radiation Oncology

## 2021-06-05 DIAGNOSIS — Z510 Encounter for antineoplastic radiation therapy: Secondary | ICD-10-CM | POA: Diagnosis not present

## 2021-06-05 DIAGNOSIS — C50411 Malignant neoplasm of upper-outer quadrant of right female breast: Secondary | ICD-10-CM | POA: Diagnosis not present

## 2021-06-05 DIAGNOSIS — Z170 Estrogen receptor positive status [ER+]: Secondary | ICD-10-CM | POA: Diagnosis not present

## 2021-06-06 ENCOUNTER — Ambulatory Visit
Admission: RE | Admit: 2021-06-06 | Discharge: 2021-06-06 | Disposition: A | Discharge status: Home / Self Care | Payer: BC Managed Care – PPO | Source: Ambulatory Visit | Attending: Radiation Oncology | Admitting: Radiation Oncology

## 2021-06-06 ENCOUNTER — Other Ambulatory Visit: Payer: Self-pay

## 2021-06-06 DIAGNOSIS — Z510 Encounter for antineoplastic radiation therapy: Secondary | ICD-10-CM | POA: Diagnosis not present

## 2021-06-06 DIAGNOSIS — Z170 Estrogen receptor positive status [ER+]: Secondary | ICD-10-CM | POA: Diagnosis not present

## 2021-06-06 DIAGNOSIS — C50411 Malignant neoplasm of upper-outer quadrant of right female breast: Secondary | ICD-10-CM | POA: Diagnosis not present

## 2021-06-07 ENCOUNTER — Ambulatory Visit
Admission: RE | Admit: 2021-06-07 | Discharge: 2021-06-07 | Disposition: A | Discharge status: Home / Self Care | Payer: BC Managed Care – PPO | Source: Ambulatory Visit | Attending: Radiation Oncology | Admitting: Radiation Oncology

## 2021-06-07 DIAGNOSIS — Z170 Estrogen receptor positive status [ER+]: Secondary | ICD-10-CM | POA: Diagnosis not present

## 2021-06-07 DIAGNOSIS — C50411 Malignant neoplasm of upper-outer quadrant of right female breast: Secondary | ICD-10-CM | POA: Diagnosis not present

## 2021-06-07 DIAGNOSIS — Z510 Encounter for antineoplastic radiation therapy: Secondary | ICD-10-CM | POA: Diagnosis not present

## 2021-06-08 ENCOUNTER — Other Ambulatory Visit: Payer: Self-pay

## 2021-06-08 ENCOUNTER — Ambulatory Visit
Admission: RE | Admit: 2021-06-08 | Discharge: 2021-06-08 | Disposition: A | Discharge status: Home / Self Care | Payer: BC Managed Care – PPO | Source: Ambulatory Visit | Attending: Radiation Oncology | Admitting: Radiation Oncology

## 2021-06-08 DIAGNOSIS — Z510 Encounter for antineoplastic radiation therapy: Secondary | ICD-10-CM | POA: Diagnosis not present

## 2021-06-08 DIAGNOSIS — C50411 Malignant neoplasm of upper-outer quadrant of right female breast: Secondary | ICD-10-CM | POA: Diagnosis not present

## 2021-06-08 DIAGNOSIS — Z170 Estrogen receptor positive status [ER+]: Secondary | ICD-10-CM | POA: Diagnosis not present

## 2021-06-11 ENCOUNTER — Ambulatory Visit
Admission: RE | Admit: 2021-06-11 | Discharge: 2021-06-11 | Disposition: A | Discharge status: Home / Self Care | Payer: BC Managed Care – PPO | Source: Ambulatory Visit | Attending: Radiation Oncology | Admitting: Radiation Oncology

## 2021-06-11 ENCOUNTER — Other Ambulatory Visit: Payer: Self-pay

## 2021-06-11 DIAGNOSIS — C50411 Malignant neoplasm of upper-outer quadrant of right female breast: Secondary | ICD-10-CM | POA: Diagnosis not present

## 2021-06-11 DIAGNOSIS — Z510 Encounter for antineoplastic radiation therapy: Secondary | ICD-10-CM | POA: Diagnosis not present

## 2021-06-11 DIAGNOSIS — Z170 Estrogen receptor positive status [ER+]: Secondary | ICD-10-CM | POA: Diagnosis not present

## 2021-06-12 ENCOUNTER — Ambulatory Visit
Admission: RE | Admit: 2021-06-12 | Discharge: 2021-06-12 | Disposition: A | Discharge status: Home / Self Care | Payer: BC Managed Care – PPO | Source: Ambulatory Visit | Attending: Radiation Oncology | Admitting: Radiation Oncology

## 2021-06-12 DIAGNOSIS — C50411 Malignant neoplasm of upper-outer quadrant of right female breast: Secondary | ICD-10-CM | POA: Diagnosis not present

## 2021-06-12 DIAGNOSIS — Z170 Estrogen receptor positive status [ER+]: Secondary | ICD-10-CM | POA: Diagnosis not present

## 2021-06-12 DIAGNOSIS — Z510 Encounter for antineoplastic radiation therapy: Secondary | ICD-10-CM | POA: Diagnosis not present

## 2021-06-13 ENCOUNTER — Other Ambulatory Visit: Payer: Self-pay

## 2021-06-13 ENCOUNTER — Ambulatory Visit
Admission: RE | Admit: 2021-06-13 | Discharge: 2021-06-13 | Disposition: A | Discharge status: Home / Self Care | Payer: BC Managed Care – PPO | Source: Ambulatory Visit | Attending: Radiation Oncology | Admitting: Radiation Oncology

## 2021-06-13 DIAGNOSIS — Z170 Estrogen receptor positive status [ER+]: Secondary | ICD-10-CM | POA: Diagnosis not present

## 2021-06-13 DIAGNOSIS — C50411 Malignant neoplasm of upper-outer quadrant of right female breast: Secondary | ICD-10-CM | POA: Diagnosis not present

## 2021-06-13 DIAGNOSIS — Z510 Encounter for antineoplastic radiation therapy: Secondary | ICD-10-CM | POA: Diagnosis not present

## 2021-06-14 ENCOUNTER — Ambulatory Visit
Admission: RE | Admit: 2021-06-14 | Discharge: 2021-06-14 | Disposition: A | Discharge status: Home / Self Care | Payer: BC Managed Care – PPO | Source: Ambulatory Visit | Attending: Radiation Oncology | Admitting: Radiation Oncology

## 2021-06-14 DIAGNOSIS — Z510 Encounter for antineoplastic radiation therapy: Secondary | ICD-10-CM | POA: Diagnosis not present

## 2021-06-14 DIAGNOSIS — C50411 Malignant neoplasm of upper-outer quadrant of right female breast: Secondary | ICD-10-CM | POA: Diagnosis not present

## 2021-06-14 DIAGNOSIS — Z170 Estrogen receptor positive status [ER+]: Secondary | ICD-10-CM | POA: Diagnosis not present

## 2021-06-15 ENCOUNTER — Other Ambulatory Visit: Payer: Self-pay

## 2021-06-15 ENCOUNTER — Ambulatory Visit
Admission: RE | Admit: 2021-06-15 | Discharge: 2021-06-15 | Disposition: A | Discharge status: Home / Self Care | Payer: BC Managed Care – PPO | Source: Ambulatory Visit | Attending: Radiation Oncology | Admitting: Radiation Oncology

## 2021-06-15 DIAGNOSIS — Z170 Estrogen receptor positive status [ER+]: Secondary | ICD-10-CM | POA: Diagnosis not present

## 2021-06-15 DIAGNOSIS — C50411 Malignant neoplasm of upper-outer quadrant of right female breast: Secondary | ICD-10-CM | POA: Diagnosis not present

## 2021-06-15 DIAGNOSIS — Z510 Encounter for antineoplastic radiation therapy: Secondary | ICD-10-CM | POA: Diagnosis not present

## 2021-06-18 ENCOUNTER — Other Ambulatory Visit: Payer: Self-pay

## 2021-06-18 ENCOUNTER — Ambulatory Visit
Admission: RE | Admit: 2021-06-18 | Discharge: 2021-06-18 | Disposition: A | Discharge status: Home / Self Care | Payer: BC Managed Care – PPO | Source: Ambulatory Visit | Attending: Radiation Oncology | Admitting: Radiation Oncology

## 2021-06-18 DIAGNOSIS — Z510 Encounter for antineoplastic radiation therapy: Secondary | ICD-10-CM | POA: Diagnosis not present

## 2021-06-18 DIAGNOSIS — Z170 Estrogen receptor positive status [ER+]: Secondary | ICD-10-CM

## 2021-06-18 DIAGNOSIS — C50411 Malignant neoplasm of upper-outer quadrant of right female breast: Secondary | ICD-10-CM | POA: Diagnosis not present

## 2021-06-18 MED ORDER — RADIAPLEXRX EX GEL: Freq: Once | CUTANEOUS | Status: AC

## 2021-06-19 ENCOUNTER — Ambulatory Visit
Admission: RE | Admit: 2021-06-19 | Discharge: 2021-06-19 | Disposition: A | Discharge status: Home / Self Care | Payer: BC Managed Care – PPO | Source: Ambulatory Visit | Attending: Radiation Oncology | Admitting: Radiation Oncology

## 2021-06-19 DIAGNOSIS — Z170 Estrogen receptor positive status [ER+]: Secondary | ICD-10-CM | POA: Diagnosis not present

## 2021-06-19 DIAGNOSIS — C50411 Malignant neoplasm of upper-outer quadrant of right female breast: Secondary | ICD-10-CM | POA: Diagnosis not present

## 2021-06-19 DIAGNOSIS — Z510 Encounter for antineoplastic radiation therapy: Secondary | ICD-10-CM | POA: Diagnosis not present

## 2021-06-20 ENCOUNTER — Ambulatory Visit
Admission: RE | Admit: 2021-06-20 | Discharge: 2021-06-20 | Disposition: A | Discharge status: Home / Self Care | Payer: BC Managed Care – PPO | Source: Ambulatory Visit | Attending: Radiation Oncology | Admitting: Radiation Oncology

## 2021-06-20 DIAGNOSIS — Z510 Encounter for antineoplastic radiation therapy: Secondary | ICD-10-CM | POA: Diagnosis not present

## 2021-06-20 DIAGNOSIS — Z170 Estrogen receptor positive status [ER+]: Secondary | ICD-10-CM | POA: Diagnosis not present

## 2021-06-20 DIAGNOSIS — C50411 Malignant neoplasm of upper-outer quadrant of right female breast: Secondary | ICD-10-CM | POA: Diagnosis not present

## 2021-06-21 ENCOUNTER — Ambulatory Visit
Admission: RE | Admit: 2021-06-21 | Discharge: 2021-06-21 | Disposition: A | Discharge status: Home / Self Care | Payer: BC Managed Care – PPO | Source: Ambulatory Visit | Attending: Radiation Oncology | Admitting: Radiation Oncology

## 2021-06-21 ENCOUNTER — Other Ambulatory Visit: Payer: Self-pay

## 2021-06-21 DIAGNOSIS — C50411 Malignant neoplasm of upper-outer quadrant of right female breast: Secondary | ICD-10-CM | POA: Diagnosis not present

## 2021-06-21 DIAGNOSIS — Z510 Encounter for antineoplastic radiation therapy: Secondary | ICD-10-CM | POA: Diagnosis not present

## 2021-06-21 DIAGNOSIS — Z170 Estrogen receptor positive status [ER+]: Secondary | ICD-10-CM | POA: Diagnosis not present

## 2021-06-22 ENCOUNTER — Ambulatory Visit: Payer: BC Managed Care – PPO

## 2021-06-22 DIAGNOSIS — C50411 Malignant neoplasm of upper-outer quadrant of right female breast: Secondary | ICD-10-CM | POA: Diagnosis not present

## 2021-06-22 DIAGNOSIS — Z170 Estrogen receptor positive status [ER+]: Secondary | ICD-10-CM | POA: Diagnosis not present

## 2021-06-22 DIAGNOSIS — Z510 Encounter for antineoplastic radiation therapy: Secondary | ICD-10-CM | POA: Diagnosis not present

## 2021-06-25 ENCOUNTER — Other Ambulatory Visit: Payer: Self-pay

## 2021-06-25 ENCOUNTER — Ambulatory Visit
Admission: RE | Admit: 2021-06-25 | Discharge: 2021-06-25 | Disposition: A | Discharge status: Home / Self Care | Payer: BC Managed Care – PPO | Source: Ambulatory Visit | Attending: Radiation Oncology | Admitting: Radiation Oncology

## 2021-06-25 DIAGNOSIS — C50411 Malignant neoplasm of upper-outer quadrant of right female breast: Secondary | ICD-10-CM | POA: Diagnosis not present

## 2021-06-25 DIAGNOSIS — Z170 Estrogen receptor positive status [ER+]: Secondary | ICD-10-CM | POA: Diagnosis not present

## 2021-06-25 DIAGNOSIS — Z510 Encounter for antineoplastic radiation therapy: Secondary | ICD-10-CM | POA: Diagnosis not present

## 2021-06-25 MED ORDER — RADIAPLEXRX EX GEL: Freq: Once | CUTANEOUS | Status: AC

## 2021-06-26 ENCOUNTER — Ambulatory Visit
Admission: RE | Admit: 2021-06-26 | Discharge: 2021-06-26 | Disposition: A | Discharge status: Home / Self Care | Payer: BC Managed Care – PPO | Source: Ambulatory Visit | Attending: Radiation Oncology | Admitting: Radiation Oncology

## 2021-06-26 DIAGNOSIS — Z170 Estrogen receptor positive status [ER+]: Secondary | ICD-10-CM | POA: Diagnosis not present

## 2021-06-26 DIAGNOSIS — Z510 Encounter for antineoplastic radiation therapy: Secondary | ICD-10-CM | POA: Diagnosis not present

## 2021-06-26 DIAGNOSIS — C50411 Malignant neoplasm of upper-outer quadrant of right female breast: Secondary | ICD-10-CM | POA: Diagnosis not present

## 2021-06-27 ENCOUNTER — Other Ambulatory Visit: Payer: Self-pay

## 2021-06-27 ENCOUNTER — Ambulatory Visit
Admission: RE | Admit: 2021-06-27 | Discharge: 2021-06-27 | Disposition: A | Discharge status: Home / Self Care | Payer: BC Managed Care – PPO | Source: Ambulatory Visit | Attending: Radiation Oncology | Admitting: Radiation Oncology

## 2021-06-27 DIAGNOSIS — Z510 Encounter for antineoplastic radiation therapy: Secondary | ICD-10-CM | POA: Diagnosis not present

## 2021-06-27 DIAGNOSIS — Z170 Estrogen receptor positive status [ER+]: Secondary | ICD-10-CM | POA: Diagnosis not present

## 2021-06-27 DIAGNOSIS — C50411 Malignant neoplasm of upper-outer quadrant of right female breast: Secondary | ICD-10-CM | POA: Diagnosis not present

## 2021-06-28 ENCOUNTER — Ambulatory Visit
Admission: RE | Admit: 2021-06-28 | Discharge: 2021-06-28 | Disposition: A | Discharge status: Home / Self Care | Payer: BC Managed Care – PPO | Source: Ambulatory Visit | Attending: Radiation Oncology | Admitting: Radiation Oncology

## 2021-06-28 DIAGNOSIS — Z170 Estrogen receptor positive status [ER+]: Secondary | ICD-10-CM | POA: Insufficient documentation

## 2021-06-28 DIAGNOSIS — C50411 Malignant neoplasm of upper-outer quadrant of right female breast: Secondary | ICD-10-CM | POA: Insufficient documentation

## 2021-06-29 ENCOUNTER — Other Ambulatory Visit: Payer: Self-pay

## 2021-06-29 ENCOUNTER — Ambulatory Visit
Admission: RE | Admit: 2021-06-29 | Discharge: 2021-06-29 | Disposition: A | Discharge status: Home / Self Care | Payer: BC Managed Care – PPO | Source: Ambulatory Visit | Attending: Radiation Oncology | Admitting: Radiation Oncology

## 2021-06-29 DIAGNOSIS — C50411 Malignant neoplasm of upper-outer quadrant of right female breast: Secondary | ICD-10-CM | POA: Diagnosis not present

## 2021-06-29 DIAGNOSIS — Z170 Estrogen receptor positive status [ER+]: Secondary | ICD-10-CM | POA: Diagnosis not present

## 2021-07-03 ENCOUNTER — Inpatient Hospital Stay: Payer: BC Managed Care – PPO | Admitting: Hematology and Oncology

## 2021-07-03 ENCOUNTER — Ambulatory Visit: Payer: BC Managed Care – PPO

## 2021-07-04 ENCOUNTER — Other Ambulatory Visit: Payer: Self-pay

## 2021-07-04 ENCOUNTER — Ambulatory Visit
Admission: RE | Admit: 2021-07-04 | Discharge: 2021-07-04 | Disposition: A | Discharge status: Home / Self Care | Payer: BC Managed Care – PPO | Source: Ambulatory Visit | Attending: Radiation Oncology | Admitting: Radiation Oncology

## 2021-07-04 DIAGNOSIS — Z170 Estrogen receptor positive status [ER+]: Secondary | ICD-10-CM | POA: Diagnosis not present

## 2021-07-04 DIAGNOSIS — C50411 Malignant neoplasm of upper-outer quadrant of right female breast: Secondary | ICD-10-CM | POA: Diagnosis not present

## 2021-07-05 ENCOUNTER — Ambulatory Visit
Admission: RE | Admit: 2021-07-05 | Discharge: 2021-07-05 | Disposition: A | Discharge status: Home / Self Care | Payer: BC Managed Care – PPO | Source: Ambulatory Visit | Attending: Radiation Oncology | Admitting: Radiation Oncology

## 2021-07-05 ENCOUNTER — Ambulatory Visit: Payer: BC Managed Care – PPO

## 2021-07-05 DIAGNOSIS — Z170 Estrogen receptor positive status [ER+]: Secondary | ICD-10-CM | POA: Diagnosis not present

## 2021-07-05 DIAGNOSIS — C50411 Malignant neoplasm of upper-outer quadrant of right female breast: Secondary | ICD-10-CM | POA: Diagnosis not present

## 2021-07-06 ENCOUNTER — Ambulatory Visit: Payer: BC Managed Care – PPO

## 2021-07-06 ENCOUNTER — Other Ambulatory Visit: Payer: Self-pay

## 2021-07-06 DIAGNOSIS — C50411 Malignant neoplasm of upper-outer quadrant of right female breast: Secondary | ICD-10-CM | POA: Diagnosis not present

## 2021-07-06 DIAGNOSIS — Z170 Estrogen receptor positive status [ER+]: Secondary | ICD-10-CM | POA: Diagnosis not present

## 2021-07-09 ENCOUNTER — Ambulatory Visit: Payer: BC Managed Care – PPO | Admitting: Radiation Oncology

## 2021-07-09 ENCOUNTER — Other Ambulatory Visit: Payer: Self-pay

## 2021-07-09 ENCOUNTER — Ambulatory Visit
Admission: RE | Admit: 2021-07-09 | Discharge: 2021-07-09 | Disposition: A | Discharge status: Home / Self Care | Payer: BC Managed Care – PPO | Source: Ambulatory Visit | Attending: Radiation Oncology | Admitting: Radiation Oncology

## 2021-07-09 DIAGNOSIS — C50411 Malignant neoplasm of upper-outer quadrant of right female breast: Secondary | ICD-10-CM | POA: Diagnosis not present

## 2021-07-09 DIAGNOSIS — Z170 Estrogen receptor positive status [ER+]: Secondary | ICD-10-CM | POA: Diagnosis not present

## 2021-07-09 MED ORDER — RADIAPLEXRX EX GEL
Freq: Once | CUTANEOUS | Status: AC
Start: 2021-07-09 — End: 2021-07-09

## 2021-07-10 ENCOUNTER — Ambulatory Visit: Payer: BC Managed Care – PPO

## 2021-07-10 ENCOUNTER — Encounter: Payer: Self-pay | Admitting: *Deleted

## 2021-07-10 DIAGNOSIS — Z170 Estrogen receptor positive status [ER+]: Secondary | ICD-10-CM | POA: Diagnosis not present

## 2021-07-10 DIAGNOSIS — C50411 Malignant neoplasm of upper-outer quadrant of right female breast: Secondary | ICD-10-CM | POA: Diagnosis not present

## 2021-07-11 ENCOUNTER — Ambulatory Visit: Payer: BC Managed Care – PPO

## 2021-07-11 ENCOUNTER — Other Ambulatory Visit: Payer: Self-pay

## 2021-07-11 DIAGNOSIS — Z170 Estrogen receptor positive status [ER+]: Secondary | ICD-10-CM | POA: Diagnosis not present

## 2021-07-11 DIAGNOSIS — C50411 Malignant neoplasm of upper-outer quadrant of right female breast: Secondary | ICD-10-CM | POA: Diagnosis not present

## 2021-07-12 ENCOUNTER — Ambulatory Visit: Payer: BC Managed Care – PPO

## 2021-07-12 DIAGNOSIS — C50411 Malignant neoplasm of upper-outer quadrant of right female breast: Secondary | ICD-10-CM | POA: Diagnosis not present

## 2021-07-12 DIAGNOSIS — Z170 Estrogen receptor positive status [ER+]: Secondary | ICD-10-CM | POA: Diagnosis not present

## 2021-07-13 ENCOUNTER — Other Ambulatory Visit: Payer: Self-pay

## 2021-07-13 ENCOUNTER — Encounter: Payer: Self-pay | Admitting: Radiation Oncology

## 2021-07-13 ENCOUNTER — Ambulatory Visit: Payer: BC Managed Care – PPO

## 2021-07-13 DIAGNOSIS — Z170 Estrogen receptor positive status [ER+]: Secondary | ICD-10-CM | POA: Diagnosis not present

## 2021-07-13 DIAGNOSIS — C50411 Malignant neoplasm of upper-outer quadrant of right female breast: Secondary | ICD-10-CM | POA: Diagnosis not present

## 2021-07-21 NOTE — Progress Notes (Signed)
Patient Care Team: Inda Coke, [place] as PCP - General (Physician Assistant) Mauro Kaufmann, RN as Oncology Nurse Navigator Rockwell Germany, RN as Oncology Nurse Navigator Stark Klein, MD as Consulting Physician (General Surgery) Nicholas Lose, MD as Consulting Physician (Hematology and Oncology) Eppie Gibson, MD as Attending Physician (Radiation Oncology)  DIAGNOSIS:    ICD-10-CM   1. Malignant neoplasm of upper-outer quadrant of right breast in female, estrogen receptor positive ([place])  C50.411    Z17.0       SUMMARY OF ONCOLOGIC HISTORY: Oncology History  Malignant neoplasm of upper-outer quadrant of right breast in female, estrogen receptor positive ([place])  03/13/2021 Initial Diagnosis   Palpable lump for 1 month, D density breast: 3.3 cm mass, axilla negative, incidental mass at 4:00: Fibrocystic change, biopsy revealed grade 2 IDC with perineural invasion, ER 90%, PR 95%, Ki-67 10%, [facility]2 [facility]   03/14/2021 Cancer Staging   Staging form: Breast, AJCC 8th Edition - Clinical stage from 03/14/2021: Stage IB (cT2, cN0, cM0, G2, ER+, PR+, HER2-) - Signed by Nicholas Lose, MD on 03/14/2021 Stage prefix: Initial diagnosis Histologic grading system: 3 grade system   04/12/2021 Surgery   Right lumpectomy: Grade 2 IDC 2.9 cm, margins negative, 1/2 lymph nodes positive, ER 90%, PR 95%, HER2 equivocal by IHC negative by FISH ratio 1.47, Ki-67 10%     CHIEF COMPLIANT: Follow-up for right breast cancer  INTERVAL HISTORY: Shannon Foster is a 60 y.o. with above-mentioned history of right breast cancer having undergone lumpectomy, currently on radiation therapy. She presents to the clinic today for follow-up.   ALLERGIES:  is allergic to codeine.  MEDICATIONS:  Current Outpatient Medications  Medication Sig Dispense Refill   Ascorbic Acid (VITAMIN C) 1000 MG tablet Take 1,000 mg by mouth daily.     aspirin EC 81 MG tablet Take 81 mg by mouth daily. Swallow whole.      cholecalciferol (VITAMIN D) 1000 units tablet Take 1,000 Units by mouth daily.     FINACEA 15 % cream APPLY GEL ONCE OR TWICE DAILY TO FACE.  2   fluticasone (FLONASE) 50 MCG/ACT nasal spray 2 sprays by Each Nare route 2 times daily.     Lysine HCl 500 MG TABS Take by mouth.     niacinamide 500 MG tablet Take 500 mg by mouth 2 (two) times daily with a meal.     Omega-3 Fatty Acids (FISH OIL) 1000 MG CAPS Take by mouth.     oxyCODONE (OXY IR/ROXICODONE) 5 MG immediate release tablet Take 0.5-1 tablets (2.5-5 mg total) by mouth every 6 (six) hours as needed for severe pain. (Patient not taking: Reported on 05/15/2021) 10 tablet 0   Potassium Gluconate 550 (90 K) MG TABS Take by mouth.     rizatriptan (MAXALT) 5 MG tablet TAKE 1 TABLET BY MOUTH AS NEEDED FOR MIGRAINE. MAY REPEAT IN 2 HOURS IF NEEDED 10 tablet 2   No current facility-administered medications for this visit.    PHYSICAL EXAMINATION: ECOG PERFORMANCE STATUS: 1 - Symptomatic but completely ambulatory  Vitals:   07/23/21 1510  BP: 115/90  Pulse: 78  Resp: 18  Temp: (!) 97.3 F (36.3 C)  SpO2: 99%   Filed Weights   07/23/21 1510  Weight: 108 lb 3.2 oz (49.1 kg)      LABORATORY DATA:  I have reviewed the data as listed CMP Latest Ref Rng & Units 03/14/2021 10/12/2019 10/30/2017  Glucose 70 - 99 mg/dL 104(H) 70 89  BUN 8 - 23 mg/dL _0 Creatinine 0.44 - 1.00 mg/dL 0.77 0.68 0.80  Sodium 135 - 145 mmol/L 144 141 140  Potassium 3.5 - 5.1 mmol/L 3.7 3.6 3.6  Chloride 98 - 111 mmol/L 105 106 110  CO2 22 - 32 mmol/L _1 Calcium 8.9 - 10.3 mg/dL 9.1 8.8 8.3(L)  Total Protein 6.5 - 8.1 g/dL 7.0 6.2 6.2(L)  Total Bilirubin 0.3 - 1.2 mg/dL 0.3 0.3 0.5  Alkaline Phos 38 - 126 U/L 74 72 54  AST 15 - 41 U/L _2 ALT 0 - 44 U/L _3 Lab Results  Component Value Date   WBC 5.1 03/14/2021   HGB 15.0 03/14/2021   HCT 43.6 03/14/2021   MCV 90.8 03/14/2021   PLT 256 03/14/2021   NEUTROABS 3.5  03/14/2021    ASSESSMENT & PLAN:  Malignant neoplasm of upper-outer quadrant of right breast in female, estrogen receptor positive ([facility]) Palpable lump for 1 month, D density breast: 3.3 cm mass, axilla negative, incidental mass at 4:00: Fibrocystic change, biopsy revealed grade 2 IDC with perineural invasion, ER 90%, PR 95%, Ki-67 10%, HER2 negative by FISH   Recommendations: 1. Breast conserving surgery 04/12/21: Grade 2 IDC 2.6 cm, 1/2 LN positive, Margins Neg, ER 90%, PR 95%, Ki-67 10%, HER2 negative by FISH 2.Mammaprint testing: Low risk 3. Adjuvant radiation therapy 05/31/2021-07/09/2021 4. Adjuvant antiestrogen therapy with letrozole 2.5 mg daily started 07/24/2019. --------------------------------------------------------------------------------------------------------------------- Letrozole counseling: We discussed the risks and benefits of anti-estrogen therapy with aromatase inhibitors. These include but not limited to insomnia, hot flashes, mood changes, vaginal dryness, bone density loss, and weight gain. We strongly believe that the benefits far outweigh the risks. Patient understands these risks and consented to starting treatment. Planned treatment duration is 7 years.  Return to clinic in 3 months for survivorship care plan visit    No orders of the defined types were placed in this encounter.  The patient has a good understanding of the overall plan. she agrees with it. she will call with any problems that may develop before the next visit here.  Total time spent: 30 mins including face to face time and time spent for planning, charting and coordination of care  Rulon Eisenmenger, MD, MPH 07/23/2021  I, Thana Ates, am acting as scribe for Dr. Nicholas Lose.  I have reviewed the above documentation for accuracy and completeness, and I agree with the above.

## 2021-07-23 ENCOUNTER — Other Ambulatory Visit: Payer: Self-pay

## 2021-07-23 ENCOUNTER — Inpatient Hospital Stay: Payer: BC Managed Care – PPO | Attending: Hematology and Oncology | Admitting: Hematology and Oncology

## 2021-07-23 VITALS — BP 115/90 | HR 78 | Temp 97.3°F | Resp 18 | Ht 65.0 in | Wt 108.2 lb

## 2021-07-23 DIAGNOSIS — C50411 Malignant neoplasm of upper-outer quadrant of right female breast: Secondary | ICD-10-CM

## 2021-07-23 DIAGNOSIS — Z79899 Other long term (current) drug therapy: Secondary | ICD-10-CM | POA: Diagnosis not present

## 2021-07-23 DIAGNOSIS — Z780 Asymptomatic menopausal state: Secondary | ICD-10-CM

## 2021-07-23 DIAGNOSIS — Z923 Personal history of irradiation: Secondary | ICD-10-CM | POA: Insufficient documentation

## 2021-07-23 DIAGNOSIS — Z7982 Long term (current) use of aspirin: Secondary | ICD-10-CM | POA: Diagnosis not present

## 2021-07-23 DIAGNOSIS — Z170 Estrogen receptor positive status [ER+]: Secondary | ICD-10-CM | POA: Diagnosis not present

## 2021-07-23 DIAGNOSIS — Z79811 Long term (current) use of aromatase inhibitors: Secondary | ICD-10-CM | POA: Diagnosis not present

## 2021-07-23 MED ORDER — LETROZOLE 2.5 MG PO TABS: 2.5000 mg | ORAL_TABLET | Freq: Every day | ORAL | 3 refills | Status: DC

## 2021-07-23 NOTE — Assessment & Plan Note (Signed)
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 Breast conserving surgery 04/12/21: Grade 2 IDC 2.6 cm, 1/2 LN positive, Margins Neg, ER 90%, PR 95%, Ki-67 10%, HER2 negative by FISH 2.Mammaprint testing: Low risk 3. Adjuvant radiation therapy 05/31/2021-07/09/2021 4. Adjuvant antiestrogen therapy with letrozole 2.5 mg daily started 07/24/2019. --------------------------------------------------------------------------------------------------------------------- Letrozole counseling: We discussed the risks and benefits of anti-estrogen therapy with aromatase inhibitors. These include but not limited to insomnia, hot flashes, mood changes, vaginal dryness, bone density loss, and weight gain. We strongly believe that the benefits far outweigh the risks. Patient understands these risks and consented to starting treatment. Planned treatment duration is 7 years.  Return to clinic in 3 months for survivorship care plan visit

## 2021-07-24 ENCOUNTER — Telehealth: Payer: Self-pay | Admitting: Hematology and Oncology

## 2021-07-24 NOTE — Telephone Encounter (Signed)
Scheduled appointment per 09/26 los. Patient is aware.

## 2021-08-01 ENCOUNTER — Telehealth: Payer: Self-pay | Admitting: *Deleted

## 2021-08-01 NOTE — Telephone Encounter (Signed)
Received call from pt with concern of hx of Raynaud's disease and taking Letrozole.  Pt states packet insert mentions increase risk for blood clots while on Letrozole.  Per MD pt okay to take Letrozole with hx of Raynaud's.  Pt educated on s/s and blood clots and verbalized understanding.

## 2021-08-06 ENCOUNTER — Ambulatory Visit (HOSPITAL_BASED_OUTPATIENT_CLINIC_OR_DEPARTMENT_OTHER)
Admission: RE | Admit: 2021-08-06 | Discharge: 2021-08-06 | Disposition: A | Discharge status: Home / Self Care | Payer: BC Managed Care – PPO | Source: Ambulatory Visit | Attending: Hematology and Oncology | Admitting: Hematology and Oncology

## 2021-08-06 ENCOUNTER — Other Ambulatory Visit: Payer: Self-pay

## 2021-08-06 DIAGNOSIS — M85851 Other specified disorders of bone density and structure, right thigh: Secondary | ICD-10-CM | POA: Diagnosis not present

## 2021-08-06 DIAGNOSIS — M810 Age-related osteoporosis without current pathological fracture: Secondary | ICD-10-CM | POA: Diagnosis not present

## 2021-08-06 DIAGNOSIS — Z780 Asymptomatic menopausal state: Secondary | ICD-10-CM | POA: Diagnosis not present

## 2021-08-13 ENCOUNTER — Encounter: Payer: Self-pay | Admitting: Radiation Oncology

## 2021-08-13 NOTE — Progress Notes (Signed)
Patient reports RT axilla pain 6/10. No other symptoms reported at this time.  I called the patient today about her upcoming follow-up appointment in radiation oncology.   Given the state of the COVID-19 pandemic, concerning case numbers in our community, and guidance from [facility], I offered a phone assessment with the patient to determine if coming to the clinic was necessary. She accepted.  The patient denies any symptomatic concerns.  Specifically, they report good healing of their skin in the radiation fields.  Skin is intact.  YES  I recommended that she continue skin care by applying oil or lotion with vitamin E to the skin in the radiation fields, BID, for 2 more months.  Continue follow-up with medical oncology - follow-up is scheduled on 08/14/21. I explained that yearly mammograms are important for patients with intact breast tissue, and physical exams are important after mastectomy for patients that cannot undergo mammography.  I encouraged her to call if she had further questions or concerns about her healing. Otherwise, she will follow-up PRN in radiation oncology. Patient is pleased with this plan, and we will change her upcoming in-person follow-up to a "telephone appointment" to help reduce the risk of COVID-19 transmission.

## 2021-08-14 ENCOUNTER — Ambulatory Visit
Admission: RE | Admit: 2021-08-14 | Discharge: 2021-08-14 | Disposition: A | Discharge status: Home / Self Care | Payer: BC Managed Care – PPO | Source: Ambulatory Visit | Attending: Radiation Oncology | Admitting: Radiation Oncology

## 2021-08-14 DIAGNOSIS — Z170 Estrogen receptor positive status [ER+]: Secondary | ICD-10-CM | POA: Insufficient documentation

## 2021-08-14 DIAGNOSIS — C50411 Malignant neoplasm of upper-outer quadrant of right female breast: Secondary | ICD-10-CM | POA: Insufficient documentation

## 2021-08-23 ENCOUNTER — Ambulatory Visit: Payer: BC Managed Care – PPO | Admitting: Gastroenterology

## 2021-08-27 ENCOUNTER — Emergency Department (HOSPITAL_BASED_OUTPATIENT_CLINIC_OR_DEPARTMENT_OTHER): Payer: BC Managed Care – PPO

## 2021-08-27 ENCOUNTER — Encounter (HOSPITAL_BASED_OUTPATIENT_CLINIC_OR_DEPARTMENT_OTHER): Payer: Self-pay | Admitting: Emergency Medicine

## 2021-08-27 ENCOUNTER — Other Ambulatory Visit: Payer: Self-pay

## 2021-08-27 ENCOUNTER — Emergency Department (HOSPITAL_BASED_OUTPATIENT_CLINIC_OR_DEPARTMENT_OTHER)
Admission: EM | Admit: 2021-08-27 | Discharge: 2021-08-27 | Disposition: A | Discharge status: Home / Self Care | Payer: BC Managed Care – PPO | Attending: Emergency Medicine | Admitting: Emergency Medicine

## 2021-08-27 DIAGNOSIS — Z853 Personal history of malignant neoplasm of breast: Secondary | ICD-10-CM | POA: Diagnosis not present

## 2021-08-27 DIAGNOSIS — R410 Disorientation, unspecified: Secondary | ICD-10-CM | POA: Diagnosis not present

## 2021-08-27 DIAGNOSIS — R1084 Generalized abdominal pain: Secondary | ICD-10-CM | POA: Insufficient documentation

## 2021-08-27 DIAGNOSIS — R1011 Right upper quadrant pain: Secondary | ICD-10-CM | POA: Diagnosis not present

## 2021-08-27 DIAGNOSIS — Z7982 Long term (current) use of aspirin: Secondary | ICD-10-CM | POA: Insufficient documentation

## 2021-08-27 DIAGNOSIS — R1013 Epigastric pain: Secondary | ICD-10-CM | POA: Diagnosis not present

## 2021-08-27 DIAGNOSIS — R109 Unspecified abdominal pain: Secondary | ICD-10-CM | POA: Diagnosis not present

## 2021-08-27 DIAGNOSIS — R197 Diarrhea, unspecified: Secondary | ICD-10-CM | POA: Insufficient documentation

## 2021-08-27 DIAGNOSIS — R1012 Left upper quadrant pain: Secondary | ICD-10-CM | POA: Diagnosis not present

## 2021-08-27 LAB — CBC
HCT: 41.5 % (ref 36.0–46.0)
Hemoglobin: 14.6 g/dL (ref 12.0–15.0)
MCH: 31.5 pg (ref 26.0–34.0)
MCHC: 35.2 g/dL (ref 30.0–36.0)
MCV: 89.4 fL (ref 80.0–100.0)
Platelets: 238 10*3/uL (ref 150–400)
RBC: 4.64 MIL/uL (ref 3.87–5.11)
RDW: 11.8 % (ref 11.5–15.5)
WBC: 4.8 10*3/uL (ref 4.0–10.5)
nRBC: 0 % (ref 0.0–0.2)

## 2021-08-27 LAB — URINALYSIS, ROUTINE W REFLEX MICROSCOPIC
Bilirubin Urine: NEGATIVE
Glucose, UA: NEGATIVE mg/dL
Hgb urine dipstick: NEGATIVE
Ketones, ur: NEGATIVE mg/dL
Leukocytes,Ua: NEGATIVE
Nitrite: NEGATIVE
Protein, ur: NEGATIVE mg/dL
Specific Gravity, Urine: 1.008 (ref 1.005–1.030)
pH: 6 (ref 5.0–8.0)

## 2021-08-27 LAB — COMPREHENSIVE METABOLIC PANEL
ALT: 24 U/L (ref 0–44)
AST: 25 U/L (ref 15–41)
Albumin: 4.1 g/dL (ref 3.5–5.0)
Alkaline Phosphatase: 59 U/L (ref 38–126)
Anion gap: 10 (ref 5–15)
BUN: 16 mg/dL (ref 8–23)
CO2: 24 mmol/L (ref 22–32)
Calcium: 8.8 mg/dL — ABNORMAL LOW (ref 8.9–10.3)
Chloride: 107 mmol/L (ref 98–111)
Creatinine, Ser: 0.69 mg/dL (ref 0.44–1.00)
GFR, Estimated: >60 mL/min (ref >60)
Glucose, Bld: 105 mg/dL — ABNORMAL HIGH (ref 70–99)
Potassium: 3.7 mmol/L (ref 3.5–5.1)
Sodium: 141 mmol/L (ref 135–145)
Total Bilirubin: 0.3 mg/dL (ref 0.3–1.2)
Total Protein: 6.7 g/dL (ref 6.5–8.1)

## 2021-08-27 LAB — LIPASE, BLOOD: Lipase: 33 U/L (ref 11–51)

## 2021-08-27 MED ORDER — IOPAMIDOL (ISOVUE-300) INJECTION 61%: 60.0000 mL | Freq: Once | INTRAVENOUS | Status: DC | PRN

## 2021-08-27 MED ORDER — IOHEXOL 300 MG/ML  SOLN
60.0000 mL | Freq: Once | INTRAMUSCULAR | Status: AC | PRN
  Administered 2021-08-27: 60 mL via INTRAVENOUS

## 2021-08-27 MED ORDER — DICYCLOMINE HCL 20 MG PO TABS: 20.0000 mg | ORAL_TABLET | Freq: Two times a day (BID) | ORAL | 0 refills | Status: DC

## 2021-08-27 NOTE — ED Triage Notes (Addendum)
Pt reports generalized abd pain radiating into her back since yesterday. Endorses loose stools, denies vomiting. Pt states she is currently receiving radiation treatment for breast cancer.

## 2021-08-27 NOTE — ED Notes (Signed)
ED Provider at bedside. 

## 2021-08-27 NOTE — ED Provider Notes (Signed)
[facility] EMERGENCY DEPT Provider Note   CSN: [id number] Arrival date & time: 08/27/21  1041     History Chief Complaint  Patient presents with   Abdominal Pain    Shannon Foster is a 60 y.o. female.  With past medical history of breast cancer currently undergoing radiation treatment who presents to the emergency department with abdominal pain.  States that yesterday she had a sudden onset of abdominal pain that she describes as achy.  States that this wore off after a few hours but recurred this morning and has been constant and progressively worsening.  She describes the pain in her right upper quadrant epigastric and left upper quadrant and has associated pain in her right flank/back.  She attempted Tums and Mylanta without relief of symptoms.  She has associated nonbloody diarrhea without nausea or vomiting.  She denies chest pain or shortness of breath.  Denies fevers, urinary symptoms, vaginal discharge.  Of note she does have a recent history of right breast cancer and is now's s/p lumpectomy and radiation treatment.  States that the radiation treatment ended 3 weeks ago.  She is not currently on chemotherapy.   Abdominal Pain Associated symptoms: diarrhea   Associated symptoms: no chest pain, no dysuria, no fever, no nausea, no shortness of breath and no vomiting       Past Medical History:  Diagnosis Date   Cancer ([place]) 02/2021   right breast IDC   Complication of anesthesia    Fibrocystic breast disease    History of shingles 2014   Migraine    PONV (postoperative nausea and vomiting)    Raynaud's disease     Patient Active Problem List   Diagnosis Date Noted   Malignant neoplasm of upper-outer quadrant of right breast in female, estrogen receptor positive ([place]) 03/13/2021   Osteopenia 05/22/2017   Vitamin D deficiency 05/22/2017   Migraine    Raynaud's disease    Fibrocystic breast disease     Past Surgical History:  Procedure  Laterality Date   BREAST LUMPECTOMY WITH SENTINEL LYMPH NODE BIOPSY Right 04/12/2021   Procedure: RIGHT BREAST LUMPECTOMY WITH SENTINEL LYMPH NODE BX;  Surgeon: Stark Klein, MD;  Location: [facility];  Service: General;  Laterality: Right;   HYSTEROSCOPY  2001   BSO- endometriosis   PELVIC LAPAROSCOPY  3x    endometriosis, LOA   TOTAL ABDOMINAL HYSTERECTOMY       OB History     Gravida  0   Para      Term      Preterm      AB      Living         SAB      IAB      Ectopic      Multiple      Live Births              Family History  Problem Relation Age of Onset   Parkinson's disease Father    Hypertension Mother    Dementia Mother    Prostate cancer Brother    Suicidality Sister     Social History   Tobacco Use   Smoking status: Never   Smokeless tobacco: Never  Vaping Use   Vaping Use: Never used  Substance Use Topics   Alcohol use: Yes    Comment: rare wine   Drug use: No    Home Medications Prior to Admission medications   Medication Sig Start Date End  Date Taking? Authorizing Provider  Ascorbic Acid (VITAMIN C) 1000 MG tablet Take 1,000 mg by mouth daily.    [provider]  aspirin EC 81 MG tablet Take 81 mg by mouth daily. Swallow whole.    [provider]  cholecalciferol (VITAMIN D) 1000 units tablet Take 1,000 Units by mouth daily.    [provider]  FINACEA 15 % cream APPLY GEL ONCE OR TWICE DAILY TO FACE. 03/03/15   [provider]  fluticasone (FLONASE) 50 MCG/ACT nasal spray 2 sprays by Each Nare route 2 times daily. Patient not taking: Reported on 08/13/2021 12/08/19   [provider]  letrozole (FEMARA) 2.5 MG tablet Take 1 tablet (2.5 mg total) by mouth daily. 07/23/21   Nicholas Lose, MD  Lysine HCl 500 MG TABS Take by mouth.    [provider]  niacinamide 500 MG tablet Take 500 mg by mouth 2 (two) times daily with a meal. Patient not taking: Reported on  08/13/2021    [provider]  Omega-3 Fatty Acids (FISH OIL) 1000 MG CAPS Take by mouth.    [provider]  Potassium Gluconate 550 (90 K) MG TABS Take by mouth.    [provider]  rizatriptan (MAXALT) 5 MG tablet TAKE 1 TABLET BY MOUTH AS NEEDED FOR MIGRAINE. MAY REPEAT IN 2 HOURS IF NEEDED 06/06/20   Inda Coke, PA    Allergies    Codeine  Review of Systems   Review of Systems  Constitutional:  Negative for appetite change and fever.  Respiratory:  Negative for shortness of breath.   Cardiovascular:  Negative for chest pain.  Gastrointestinal:  Positive for abdominal pain and diarrhea. Negative for nausea and vomiting.  Genitourinary:  Negative for dysuria.  Musculoskeletal:  Positive for back pain.  All other systems reviewed and are negative.  Physical Exam Updated Vital Signs BP 112/73 (BP Location: Right Arm)   Pulse 99   Temp 98.3 F (36.8 C) (Oral)   Resp 16   Ht 5\' 5"  (1.651 m)   Wt 49 kg   SpO2 98%   BMI 17.97 kg/m   Physical Exam Vitals and nursing note reviewed.  Constitutional:      General: She is not in acute distress.    Appearance: Normal appearance. She is well-developed. She is not toxic-appearing.  HENT:     Head: Normocephalic and atraumatic.     Nose: Nose normal.     Mouth/Throat:     Mouth: Mucous membranes are moist.     Pharynx: Oropharynx is clear.  Eyes:     General: No scleral icterus.    Extraocular Movements: Extraocular movements intact.  Cardiovascular:     Rate and Rhythm: Normal rate and regular rhythm.     Heart sounds: Normal heart sounds. No murmur heard. Pulmonary:     Effort: Pulmonary effort is normal. No respiratory distress.     Breath sounds: Normal breath sounds.  Abdominal:     General: Abdomen is flat. Bowel sounds are normal. There is no distension.     Palpations: Abdomen is soft. There is no pulsatile mass.     Tenderness: There is abdominal tenderness in the right upper  quadrant, right lower quadrant, epigastric area and periumbilical area. There is right CVA tenderness and left CVA tenderness. There is no guarding or rebound. Positive signs include Murphy's sign. Negative signs include McBurney's sign.     Hernia: No hernia is present.  Musculoskeletal:  General: Normal range of motion.     Cervical back: Normal range of motion and neck supple.  Skin:    General: Skin is warm and dry.     Capillary Refill: Capillary refill takes less than 2 seconds.  Neurological:     General: No focal deficit present.     Mental Status: She is alert. She is disoriented.  Psychiatric:        Mood and Affect: Mood normal.        Behavior: Behavior normal.    ED Results / Procedures / Treatments   Labs (all labs ordered are listed, but only abnormal results are displayed) Labs Reviewed  COMPREHENSIVE METABOLIC PANEL - Abnormal; Notable for the following components:      Result Value   Glucose, Bld 105 (*)    Calcium 8.8 (*)    All other components within normal limits  URINALYSIS, ROUTINE W REFLEX MICROSCOPIC - Abnormal; Notable for the following components:   Color, Urine COLORLESS (*)    All other components within normal limits  LIPASE, BLOOD  CBC   EKG None  Radiology CT ABDOMEN PELVIS W CONTRAST  Result Date: 08/27/2021 CLINICAL DATA:  Acute generalized abdominal pain. EXAM: CT ABDOMEN AND PELVIS WITH CONTRAST TECHNIQUE: Multidetector CT imaging of the abdomen and pelvis was performed using the standard protocol following bolus administration of intravenous contrast. CONTRAST:  82mL OMNIPAQUE IOHEXOL 300 MG/ML  SOLN COMPARISON:  October 31, 2017. FINDINGS: Lower chest: No acute abnormality. Hepatobiliary: No gallstones are noted. No biliary dilatation is noted. Stable hepatic cysts are noted. Pancreas: Unremarkable. No pancreatic ductal dilatation or surrounding inflammatory changes. Spleen: Normal in size without focal abnormality. Adrenals/Urinary  Tract: Adrenal glands are unremarkable. Kidneys are normal, without renal calculi, focal lesion, or hydronephrosis. Bladder is unremarkable. Stomach/Bowel: Stomach is within normal limits. Appendix appears normal. No evidence of bowel wall thickening, distention, or inflammatory changes. Vascular/Lymphatic: No significant vascular findings are present. No enlarged abdominal or pelvic lymph nodes. Reproductive: Status post hysterectomy. No adnexal masses. Other: No abdominal wall hernia or abnormality. No abdominopelvic ascites. Musculoskeletal: No acute or significant osseous findings. IMPRESSION: No acute abnormality seen in the abdomen or pelvis. Electronically Signed   By: Marijo Conception M.D.   On: 08/27/2021 15:17   US Abdomen Limited RUQ (LIVER/GB)  Result Date: 08/27/2021 CLINICAL DATA:  Right upper quadrant abdominal pain for 1 day. History of breast cancer. EXAM: ULTRASOUND ABDOMEN LIMITED RIGHT UPPER QUADRANT COMPARISON:  10/31/2017 CT abdomen/pelvis FINDINGS: Gallbladder: No gallstones or wall thickening visualized. No sonographic Murphy sign noted by sonographer. Common bile duct: Diameter: 2 mm Liver: Small 1.2 x 1.1 x 0.7 cm left liver cyst. Small 0.8 x 0.4 x 0.4 cm lateral segment left liver cyst. Hypoechoic 1.0 x 1.0 x 0.9 cm posterior superior right liver lesion, which probably correlates with a hypodense 1.2 cm lesion seen in the segment 7 right liver on 10/31/2017 CT abdomen/pelvis study. Background liver parenchymal echogenicity and echotexture are normal. Portal vein is patent on color Doppler imaging with normal direction of blood flow towards the liver. Other: None. IMPRESSION: 1. No acute abnormality. No cholelithiasis. No biliary ductal dilatation. 2. Hypoechoic 1.0 cm posterosuperior right liver lesion, indeterminate, although probably correlating with a hypodense 1.2 cm lesion seen in the segment 7 right liver on 10/31/2017 CT abdomen/pelvis study, favoring a benign lesion. Additional  small benign-appearing left liver cysts. Given the history of breast cancer, outpatient MRI abdomen without and with IV contrast may be  considered for further evaluation, as clinically warranted. Electronically Signed   By: Ilona Sorrel M.D.   On: 08/27/2021 13:34    Procedures Procedures   Medications Ordered in ED Medications  iohexol (OMNIPAQUE) 300 MG/ML solution 60 mL (60 mLs Intravenous Contrast Given 08/27/21 1503)    ED Course  I have reviewed the triage vital signs and the nursing notes.  Pertinent labs & imaging results that were available during my care of the patient were reviewed by me and considered in my medical decision making (see chart for details).    MDM Rules/Calculators/A&P 60 year old female who presents emergency department abdominal pain.  Abdominal exam without peritoneal signs.  No evidence of acute abdomen on exam.  She is well-appearing.  Considered but doubt ovarian torsion given her history and presentation. Labs unremarkable Lipase negative so doubt acute pancreatitis. No transaminitis, right upper quadrant ultrasound without cholecystitis, cholelithiasis.  Doubt acute hepatitis. Presentation not consistent with peptic ulcer disease or gastric perforation. UA without UTI, no flank pain or fevers so doubt pyelonephritis. CT abdomen pelvis with contrast performed which showed no acute appendicitis, vascular catastrophe's, bowel obstruction, viscus perforations or diverticulitis.  Unclear etiology of her abdominal pain however her work-up here in the emergency department has been reassuring.  Likely has gastroenteritis with a history of diarrhea.  I am prescribing her Bentyl.  She is encouraged to push fluids, increase fiber intake, small bland meals and Bentyl.  I have instructed follow-up primary care physician in 3 days for continued work-up if she has similar pain.  She is instructed return to emergency department shows increasing abdominal pain, fevers,  distention.  She understands discharge teaching with teach back.  All questions have been answered at this time.  Vital signs are stable and she is safe for discharge  Final Clinical Impression(s) / ED Diagnoses Final diagnoses:  RUQ abdominal pain  Generalized abdominal pain    Rx / DC Orders ED Discharge Orders          Ordered    dicyclomine (BENTYL) 20 MG tablet  2 times daily        08/27/21 1523             Mickie Hillier, PA-C 08/27/21 1528    Sherwood Gambler, MD 08/28/21 [phone]

## 2021-08-27 NOTE — Discharge Instructions (Signed)
You were seen in the emergency department today for abdominal pain.  While you were here we did lab work which was all reassuring and did not show any abnormalities.  Additionally we did an ultrasound of your gallbladder and liver which did not show any gallstones or inflammation of your gallbladder.  We did a CT scan of your abdomen and pelvis given your history of breast cancer which was normal.  It is likely that you probably have gastroenteritis which is an inflammation of the colon usually caused by a virus or something that you ate.  This may be why you are having some diarrhea.  I have prescribed you a medication called Bentyl which is an antispasmodic and will hopefully decrease the abdominal pain and diarrhea that you are having.  In addition to the Bentyl please continue to have small bland meals and avoid greasy or fatty foods.  Increase your intake of fiber.  Please call your general practitioner in 3 days if you are not having improvement in your symptoms.  He should return to the emergency department if you have increasing abdominal pain, fevers, distention or blood in your diarrhea.

## 2021-08-27 NOTE — ED Notes (Signed)
Patient verbalizes understanding of discharge instructions. Opportunity for questioning and answers were provided. Patient discharged from ED.  °

## 2021-08-29 NOTE — Progress Notes (Signed)
                                                                                                                                                             Patient Name: Shannon Foster MRN: [id number] DOB: 01-15-1961 Referring Physician: Stark Klein (Profile Not Attached) Date of Service: 07/13/2021 [facility], [place]                                                        End Of Treatment Note  Diagnoses: C50.411-Malignant neoplasm of upper-outer quadrant of right female breast  Cancer Staging: Cancer Staging Malignant neoplasm of upper-outer quadrant of right breast in female, estrogen receptor positive ([place]) Staging form: Breast, AJCC 8th Edition - Clinical stage from 03/14/2021: Stage IB (cT2, cN0, cM0, G2, ER+, PR+, HER2-) - Signed by Nicholas Lose, MD on 03/14/2021 Stage prefix: Initial diagnosis Histologic grading system: 3 grade system pT2, pN1a  Intent: Curative  Radiation Treatment Dates: 05/30/2021 through 07/13/2021 Site Technique Total Dose (Gy) Dose per Fx (Gy) Completed Fx Beam Energies  Breast, Right: Breast_Rt 3D 50/50 2 25/25 6X, 10X  Breast, Right: Breast_Rt_PAB_SCV 3D 50/50 2 25/25 6X, 10X  Breast, Right: Breast_Rt_Bst specialPort 10/10 2 5/5 6E, 9E   Narrative: The patient tolerated radiation therapy relatively well.   Plan: The patient will follow-up with radiation oncology in [place] as needed. -----------------------------------  SEppie Gibson MD

## 2021-09-13 DIAGNOSIS — L565 Disseminated superficial actinic porokeratosis (DSAP): Secondary | ICD-10-CM | POA: Diagnosis not present

## 2021-09-13 DIAGNOSIS — L814 Other melanin hyperpigmentation: Secondary | ICD-10-CM | POA: Diagnosis not present

## 2021-09-13 DIAGNOSIS — Z23 Encounter for immunization: Secondary | ICD-10-CM | POA: Diagnosis not present

## 2021-09-30 ENCOUNTER — Other Ambulatory Visit: Payer: Self-pay | Admitting: Physician Assistant

## 2021-10-03 ENCOUNTER — Other Ambulatory Visit: Payer: Self-pay | Admitting: Physician Assistant

## 2021-10-04 ENCOUNTER — Other Ambulatory Visit: Payer: Self-pay | Admitting: Physician Assistant

## 2021-10-09 ENCOUNTER — Other Ambulatory Visit: Payer: Self-pay | Admitting: Physician Assistant

## 2021-10-09 ENCOUNTER — Telehealth: Payer: Self-pay | Admitting: *Deleted

## 2021-10-09 NOTE — Telephone Encounter (Signed)
Shannon Foster, pt requesting refill Finacea 15% cream. Has not been filled by you. Please advise.

## 2021-10-19 ENCOUNTER — Other Ambulatory Visit: Payer: Self-pay

## 2021-10-19 ENCOUNTER — Inpatient Hospital Stay: Payer: BC Managed Care – PPO | Attending: Hematology and Oncology | Admitting: Adult Health

## 2021-10-19 ENCOUNTER — Encounter: Payer: Self-pay | Admitting: Adult Health

## 2021-10-19 VITALS — BP 127/86 | HR 94 | Temp 97.7°F | Resp 18 | Wt 110.2 lb

## 2021-10-19 DIAGNOSIS — C50411 Malignant neoplasm of upper-outer quadrant of right female breast: Secondary | ICD-10-CM | POA: Diagnosis not present

## 2021-10-19 DIAGNOSIS — Z170 Estrogen receptor positive status [ER+]: Secondary | ICD-10-CM | POA: Insufficient documentation

## 2021-10-19 DIAGNOSIS — Z923 Personal history of irradiation: Secondary | ICD-10-CM | POA: Diagnosis not present

## 2021-10-19 DIAGNOSIS — Z79899 Other long term (current) drug therapy: Secondary | ICD-10-CM | POA: Diagnosis not present

## 2021-10-19 DIAGNOSIS — K219 Gastro-esophageal reflux disease without esophagitis: Secondary | ICD-10-CM | POA: Insufficient documentation

## 2021-10-19 MED ORDER — EXEMESTANE 25 MG PO TABS: 25.0000 mg | ORAL_TABLET | Freq: Every day | ORAL | 3 refills | Status: AC

## 2021-10-19 NOTE — Progress Notes (Signed)
SURVIVORSHIP VISIT:    BRIEF ONCOLOGIC HISTORY:  Oncology History  Malignant neoplasm of upper-outer quadrant of right breast in female, estrogen receptor positive ([place])  03/13/2021 Initial Diagnosis   Palpable lump for 1 month, D density breast: 3.3 cm mass, axilla negative, incidental mass at 4:00: Fibrocystic change, biopsy revealed grade 2 IDC with perineural invasion, ER 90%, PR 95%, Ki-67 10%, HER2 negative by [facility]   03/14/2021 Cancer Staging   Staging form: Breast, AJCC 8th Edition - Clinical stage from 03/14/2021: Stage IB (cT2, cN0, cM0, G2, ER+, PR+, HER2-) - Signed by Nicholas Lose, MD on 03/14/2021 Stage prefix: Initial diagnosis Histologic grading system: 3 grade system    04/12/2021 Surgery   Right lumpectomy: Grade 2 IDC 2.9 cm, margins negative, 1/2 lymph nodes positive, ER 90%, PR 95%, HER2 equivocal by IHC negative by FISH ratio 1.47, Ki-67 10%   04/12/2021 Miscellaneous   Mammaprint: low risk indicating no benefit from chemotherapy   04/12/2021 Cancer Staging   Staging form: Breast, AJCC 8th Edition - Pathologic stage from 04/12/2021: Stage IB (pT2, pN1a, cM0, G2, ER+, PR+, HER2-) - Signed by Gardenia Phlegm, NP on 10/18/2021 Stage prefix: Initial diagnosis Histologic grading system: 3 grade system    05/30/2021 - 07/13/2021 Radiation Therapy   05/30/2021 through 07/13/2021 Site Technique Total Dose (Gy) Dose per Fx (Gy) Completed Fx Beam Energies  Breast, Right: Breast_Rt 3D 50/50 2 25/25 6X, 10X  Breast, Right: Breast_Rt_PAB_SCV 3D 50/50 2 25/25 6X, 10X  Breast, Right: Breast_Rt_Bst specialPort 10/10 2 5/5 6E, 9E    06/2021 -  Anti-estrogen oral therapy   Letrozole daily     INTERVAL HISTORY:  Ms. Newland to review her survivorship care plan detailing her treatment course for breast cancer, as well as monitoring long-term side effects of that treatment, education regarding health maintenance, screening, and overall wellness and health promotion.     Melvia  has had difficulty tolerating the letrozole.  She has had overall joint aches and pains.  She does have a history of rainouts.  This has made it a lot worse.  She also feels like she has a vascular headache that is Like a spasm that shoots up into her head.  She has tried Advil to alleviate this and it has not helped.  She says that this is concerning to her because she worries about the possibility of a stroke.  Her blood pressure is normal today.  Due to the above side effects Bernadette has stopped taking the letrozole.  She stopped this past Monday.  She wants to know what her next steps are moving forward.  REVIEW OF SYSTEMS:  Review of Systems  Constitutional:  Positive for fatigue. Negative for appetite change, chills, fever and unexpected weight change.  HENT:   Negative for hearing loss, lump/mass and trouble swallowing.   Eyes:  Negative for eye problems and icterus.  Respiratory:  Negative for chest tightness, cough and shortness of breath.   Cardiovascular:  Negative for chest pain, leg swelling and palpitations.  Gastrointestinal:  Negative for abdominal distention, abdominal pain, constipation, diarrhea, nausea and vomiting.  Endocrine: Negative for hot flashes.  Genitourinary:  Negative for difficulty urinating.   Musculoskeletal:  Positive for arthralgias and neck pain. Negative for gait problem.  Skin:  Negative for itching and rash.  Neurological:  Positive for headaches. Negative for dizziness, extremity weakness, gait problem, light-headedness, numbness, seizures and speech difficulty.  Hematological:  Negative for adenopathy. Does not bruise/bleed easily.  Psychiatric/Behavioral:  Negative  for depression. The patient is not nervous/anxious.   Breast: Denies any new nodularity, masses, tenderness, nipple changes, or nipple discharge.    ONCOLOGY TREATMENT TEAM:  1. Surgeon:  Dr. Barry Dienes at [facility] 2. Medical Oncologist: Dr. Lindi Adie 3. Radiation Oncologist: Dr.  Lisbeth Renshaw    PAST MEDICAL/SURGICAL HISTORY:  Past Medical History:  Diagnosis Date   Cancer ([place]) 02/2021   right breast IDC   Complication of anesthesia    Fibrocystic breast disease    History of shingles 2014   Migraine    PONV (postoperative nausea and vomiting)    Raynaud's disease    Past Surgical History:  Procedure Laterality Date   BREAST LUMPECTOMY WITH SENTINEL LYMPH NODE BIOPSY Right 04/12/2021   Procedure: RIGHT BREAST LUMPECTOMY WITH SENTINEL LYMPH NODE BX;  Surgeon: Stark Klein, MD;  Location: [facility];  Service: General;  Laterality: Right;   HYSTEROSCOPY  2001   BSO- endometriosis   PELVIC LAPAROSCOPY  3x    endometriosis, LOA   TOTAL ABDOMINAL HYSTERECTOMY       ALLERGIES:  Allergies  Allergen Reactions   Codeine Nausea And Vomiting     CURRENT MEDICATIONS:  Outpatient Encounter Medications as of 10/19/2021  Medication Sig   Ascorbic Acid (VITAMIN C) 1000 MG tablet Take 1,000 mg by mouth daily.   aspirin EC 81 MG tablet Take 81 mg by mouth daily. Swallow whole.   cholecalciferol (VITAMIN D) 1000 units tablet Take 1,000 Units by mouth daily.   FINACEA 15 % cream APPLY GEL ONCE OR TWICE DAILY TO FACE.   Lysine HCl 500 MG TABS Take by mouth.   niacinamide 500 MG tablet Take 500 mg by mouth 2 (two) times daily with a meal.   Omega-3 Fatty Acids (FISH OIL) 1000 MG CAPS Take by mouth.   Potassium Gluconate 550 (90 K) MG TABS Take by mouth.   fluticasone (FLONASE) 50 MCG/ACT nasal spray 2 sprays by Each Nare route 2 times daily. (Patient not taking: Reported on 08/13/2021)   letrozole (FEMARA) 2.5 MG tablet Take 1 tablet (2.5 mg total) by mouth daily. (Patient not taking: Reported on 10/19/2021)   rizatriptan (MAXALT) 5 MG tablet TAKE 1 TABLET BY MOUTH AS NEEDED FOR MIGRAINE. MAY REPEAT IN 2 HOURS IF NEEDED (Patient not taking: Reported on 10/19/2021)   [DISCONTINUED] dicyclomine (BENTYL) 20 MG tablet Take 1 tablet (20 mg total) by mouth 2  (two) times daily.   No facility-administered encounter medications on file as of 10/19/2021.     ONCOLOGIC FAMILY HISTORY:  Family History  Problem Relation Age of Onset   Parkinson's disease Father    Hypertension Mother    Dementia Mother    Prostate cancer Brother    Suicidality Sister      GENETIC COUNSELING/TESTING: Neg  SOCIAL HISTORY:  Social History   Socioeconomic History   Marital status: Widowed    Spouse name: Not on file   Number of children: 0   Years of education: Not on file   Highest education level: Not on file  Occupational History   Not on file  Tobacco Use   Smoking status: Never   Smokeless tobacco: Never  Vaping Use   Vaping Use: Never used  Substance and Sexual Activity   Alcohol use: Yes    Comment: rare wine   Drug use: No   Sexual activity: Yes    Partners: Male    Birth control/protection: Surgical    Comment: TAH/BSO  Other Topics  Concern   Not on file  Social History Narrative   Not on file   Social Determinants of Health   Financial Resource Strain: Not on file  Food Insecurity: Not on file  Transportation Needs: No Transportation Needs   Lack of Transportation (Medical): No   Lack of Transportation (Non-Medical): No  Physical Activity: Sufficiently Active   Days of Exercise per Week: 7 days   Minutes of Exercise per Session: 60 min  Stress: Not on file  Social Connections: Not on file  Intimate Partner Violence: Not At Risk   Fear of Current or Ex-Partner: No   Emotionally Abused: No   Physically Abused: No   Sexually Abused: No     OBSERVATIONS/OBJECTIVE:  BP 127/86 (BP Location: Left Arm, Patient Position: Sitting)    Pulse 94    Temp 97.7 F (36.5 C) (Temporal)    Resp 18    Wt 110 lb 3.2 oz (50 kg)    SpO2 100%    BMI 18.34 kg/m  GENERAL: Patient is a well appearing female in no acute distress HEENT:  Sclerae anicteric.  Oropharynx clear and moist. No ulcerations or evidence of oropharyngeal candidiasis.  Neck is supple.  NODES:  No cervical, supraclavicular, or axillary lymphadenopathy palpated.  BREAST EXAM:  Small 0.5cm well circumscribed nodule in left lower outer breast, right breast s/p lumpectomy and radiation therapy.   LUNGS:  Clear to auscultation bilaterally.  No wheezes or rhonchi. HEART:  Regular rate and rhythm. No murmur appreciated. ABDOMEN:  Soft, nontender.  Positive, normoactive bowel sounds. No organomegaly palpated. MSK:  No focal spinal tenderness to palpation. Full range of motion bilaterally in the upper extremities. EXTREMITIES:  No peripheral edema.   SKIN:  Clear with no obvious rashes or skin changes. No nail dyscrasia. NEURO:  Nonfocal. Well oriented.  Appropriate affect.  LABORATORY DATA:  None for this visit.  DIAGNOSTIC IMAGING:  None for this visit.      ASSESSMENT AND PLAN:  Shannon Foster is a pleasant 60 y.o. female with Stage IB right breast invasive ductal carcinoma, ER+/PR+/HER2-, diagnosed in 02/2021, treated with lumpectomy, adjuvant radiation therapy, and anti-estrogen therapy with Letrozole beginning in 06/2021 (currently holding).  She presents to the Survivorship Clinic for our initial meeting and routine follow-up post-completion of treatment for breast cancer.    1. Stage IB right breast cancer:  Ms. Walth is continuing to recover from definitive treatment for breast cancer. She will follow-up with her medical oncologist, Dr. Lindi Adie in 6 months with history and physical exam per surveillance protocol.  Her mammogram is due 01/2022; orders placed today. Today, a comprehensive survivorship care plan and treatment summary was reviewed with the patient today detailing her breast cancer diagnosis, treatment course, potential late/long-term effects of treatment, appropriate follow-up care with recommendations for the future, and patient education resources.  A copy of this summary, along with a letter will be sent to the patients primary care provider via  mail/fax/In Basket message after todays visit.    2.  Arthralgias: Her rainouts and other musculoskeletal difficulty was exacerbated by taking the letrozole.  I recommended that she stay off of the letrozole.  I sent in exemestane for her to try beginning the second week in January.  3.  Posterior headache: This is new and unusual headache for [facility].  Considering that she feels a little bit off balance from time to time and that this headache is new, I have ordered an MRI of the brain to evaluate  further.  4.  Left breast nodule: I have placed orders for a left breast diagnostic mammogram and accompanying ultrasound.  She would like all further breast imaging to be completed at the breast center.  I have placed orders to be completed at this location and contacted by nurse to help coordinate this scheduling.  5. Bone health:  Given Ms. Hefner's age/history of breast cancer and her current treatment regimen including anti-estrogen therapy with aromatase inhibitor, she is at risk for bone demineralization.  Her last DEXA scan was October, 2022 which showed osteoporosis with a T score of -2.8 in the AP spine L1-L4.  She was given education on specific activities to promote bone health.  6. Cancer screening:  Due to Ms. Bembenek's history and her age, she should receive screening for skin cancers, colon cancer, and gynecologic cancers.  The information and recommendations are listed on the patient's comprehensive care plan/treatment summary and were reviewed in detail with the patient.    7. Health maintenance and wellness promotion: Ms. Youngers was encouraged to consume 5-7 servings of fruits and vegetables per day. We reviewed the "Nutrition Rainbow" handout,.  She was also encouraged to engage in moderate to vigorous exercise for 30 minutes per day most days of the week. We discussed the [employer] fitness program, which is designed for cancer survivors to help them become more physically fit after  cancer treatments.  She was instructed to limit her alcohol consumption and continue to abstain from tobacco use.     8. Support services/counseling: It is not uncommon for this period of the patient's cancer care trajectory to be one of many emotions and stressors.     She was given information regarding our available services and encouraged to contact me with any questions or for help enrolling in any of our support group/programs.    Follow up instructions:    -Return to cancer center 6 months for f/u with Dr. Lindi Adie  -Mammogram due in 01/2022 -Left breast diagnostic mammogram and ultrasound in the next 1 to 2 weeks -MRI of the brain -Follow up with surgery 1 year -She is welcome to return back to the Survivorship Clinic at any time; no additional follow-up needed at this time.  -Consider referral back to survivorship as a long-term survivor for continued surveillance  The patient was provided an opportunity to ask questions and all were answered. The patient agreed with the plan and demonstrated an understanding of the instructions.   Total encounter time: 50 minutes in face-to-face visit time, chart review, lab review, care coordination, and documentation of the encounter.  Wilber Bihari, NP 10/19/21 12:13 PM Medical Oncology and Hematology [facility] [place],  [address] Tel. [phone]    Fax. [phone]  *Total Encounter Time as defined by the Centers for Medicare and Medicaid Services includes, in addition to the face-to-face time of a patient visit (documented in the note above) non-face-to-face time: obtaining and reviewing outside history, ordering and reviewing medications, tests or procedures, care coordination (communications with other health care professionals or caregivers) and documentation in the medical record.

## 2021-10-23 ENCOUNTER — Encounter: Payer: BC Managed Care – PPO | Admitting: Adult Health

## 2021-10-25 ENCOUNTER — Ambulatory Visit (HOSPITAL_COMMUNITY)
Admission: RE | Admit: 2021-10-25 | Discharge: 2021-10-25 | Disposition: A | Discharge status: Home / Self Care | Payer: BC Managed Care – PPO | Source: Ambulatory Visit | Attending: Adult Health | Admitting: Adult Health

## 2021-10-25 DIAGNOSIS — C50411 Malignant neoplasm of upper-outer quadrant of right female breast: Secondary | ICD-10-CM | POA: Insufficient documentation

## 2021-10-25 DIAGNOSIS — G9389 Other specified disorders of brain: Secondary | ICD-10-CM | POA: Diagnosis not present

## 2021-10-25 DIAGNOSIS — Z170 Estrogen receptor positive status [ER+]: Secondary | ICD-10-CM | POA: Diagnosis not present

## 2021-10-25 DIAGNOSIS — R519 Headache, unspecified: Secondary | ICD-10-CM | POA: Diagnosis not present

## 2021-10-25 MED ORDER — GADOBUTROL 1 MMOL/ML IV SOLN
6.0000 mL | Freq: Once | INTRAVENOUS | Status: AC | PRN
  Administered 2021-10-25: 21:00:00 6 mL via INTRAVENOUS

## 2021-10-30 ENCOUNTER — Telehealth: Payer: Self-pay

## 2021-10-30 NOTE — Telephone Encounter (Signed)
Called pt, per Shannon Foster, MRI does not show cancer, pt verbalized understanding.  Pt will share this with her PCP.

## 2021-10-31 ENCOUNTER — Telehealth: Payer: Self-pay | Admitting: *Deleted

## 2021-10-31 NOTE — Telephone Encounter (Signed)
This RN called pt per her request for further discussion of MRI and noted migraines.   Obtained her VM and requested her to call to this RN - information given for return call.

## 2021-11-01 IMAGING — MR MR BREAST BILAT WO/W CM
8 of 12 series · 32 of 48 positions shown · IV contrast (5 ml gadavist)
Comparison: None.

CLINICAL DATA: 60-year-old female with newly diagnosed right breast
cancer.

LABS:  None.
EXAM:
BILATERAL BREAST MRI WITH AND WITHOUT CONTRAST
TECHNIQUE: Multiplanar, multisequence MR images of both breasts were obtained
prior to and following the intravenous administration of 5 ml of
Gadavist

[Series 2: t2_tirm_tra ipat (a-p) · axial · 3.0mm · 0.62mm/px · 1 of 50 slices shown]
[im 1/50]
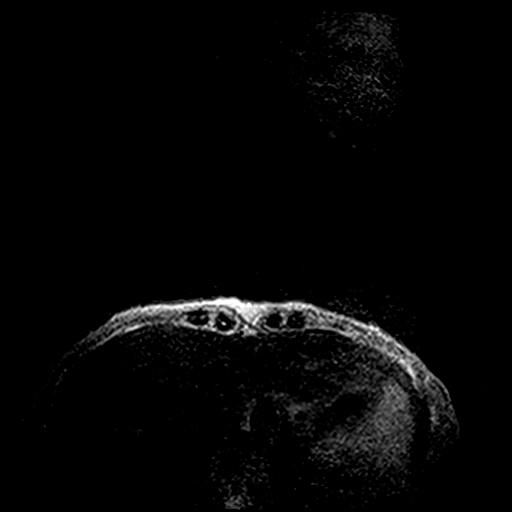

[Series 3: fl3d pre-cm no · axial · non-contrast · 1.2mm · 0.83mm/px · z∈[-103,+69]mm · 5 of 144 slices shown]
[im 1/144]
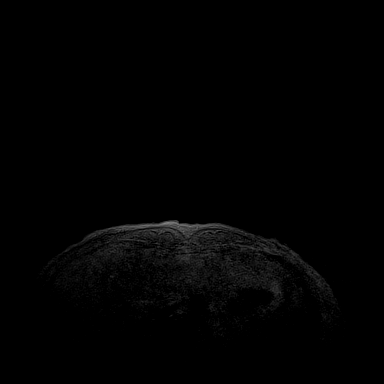
[im 36/144]
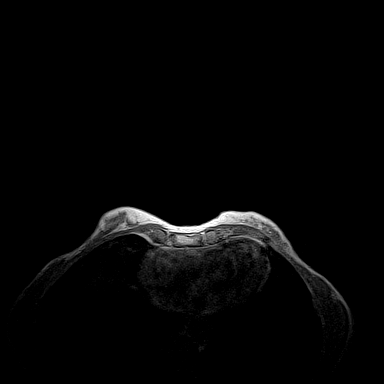
[im 72/144]
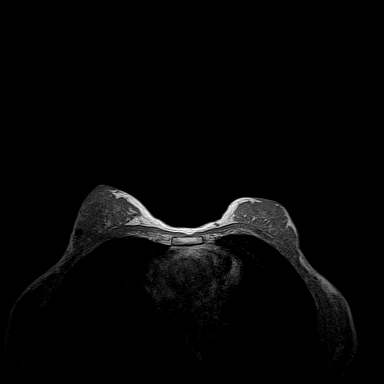
[im 108/144]
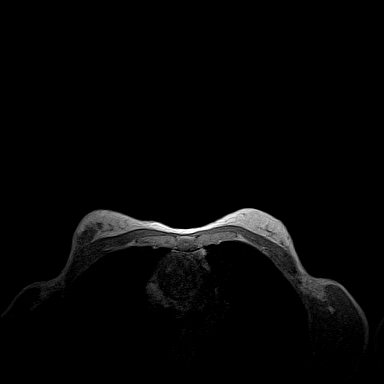
[im 144/144]
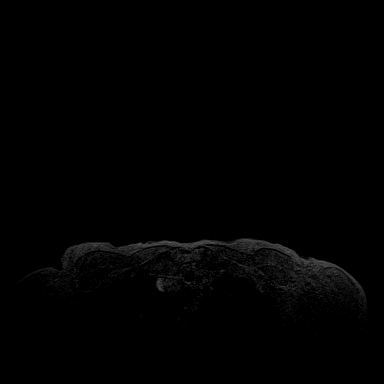

[Series 4: fl3d pre-cm · axial · non-contrast · 1.2mm · 0.83mm/px · z∈[-103,+69]mm · 5 of 144 slices shown]
[im 1/144]
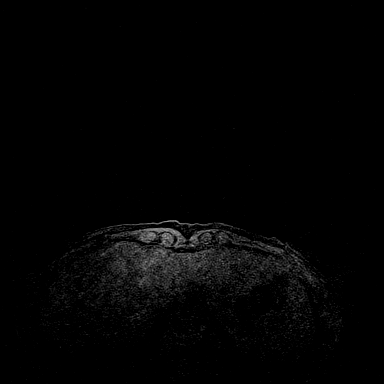
[im 36/144]
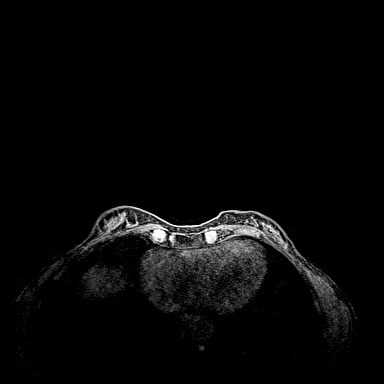
[im 72/144]
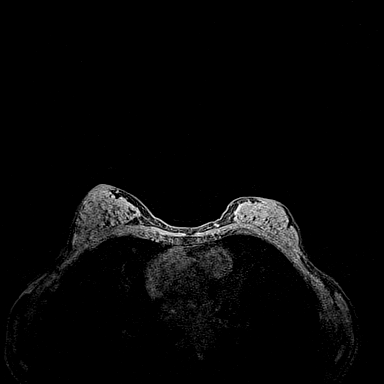
[im 108/144]
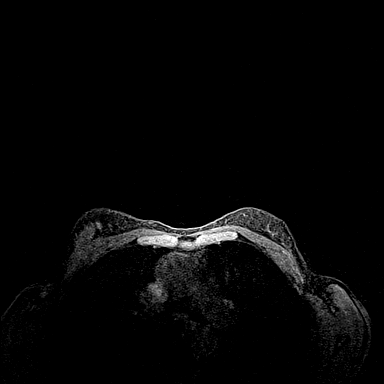
[im 144/144]
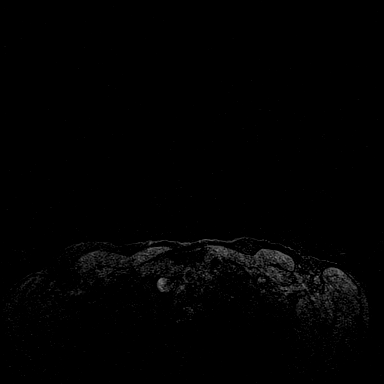

[Series 5: fl3d post-cm 20 · axial · 1.2mm · 0.83mm/px · z∈[-103,+69]mm · 5 of 144 slices shown (1 of 3)]
[im 1/144]
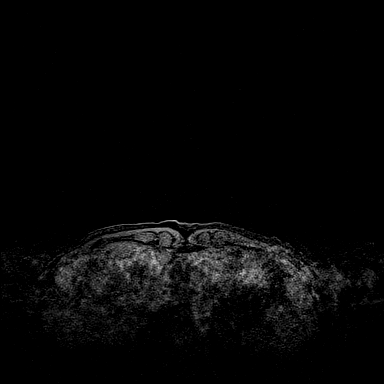
[im 36/144]
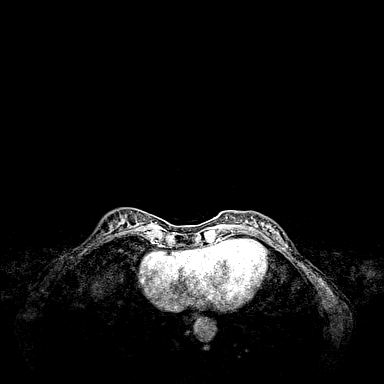
[im 72/144]
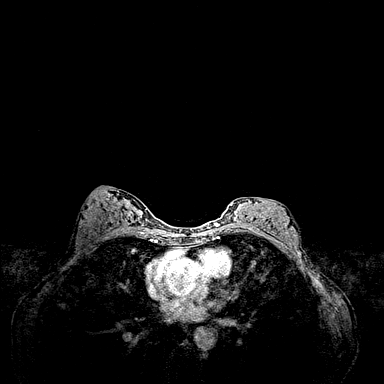
[im 108/144]
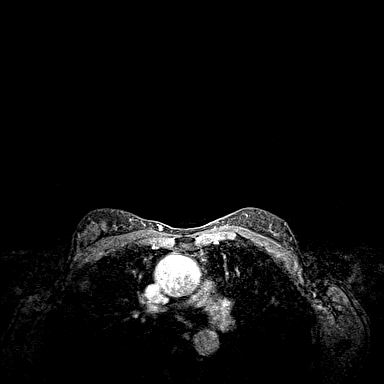
[im 144/144]
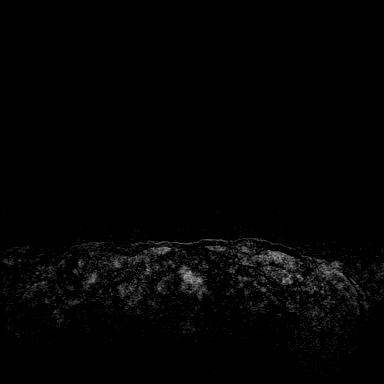

[Series 6: fl3d post-cm 20 · axial · 1.2mm · 0.83mm/px · z∈[-103,+69]mm · 5 of 144 slices shown (2 of 3)]
[im 1/144]
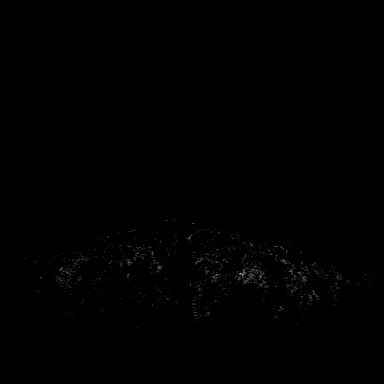
[im 36/144]
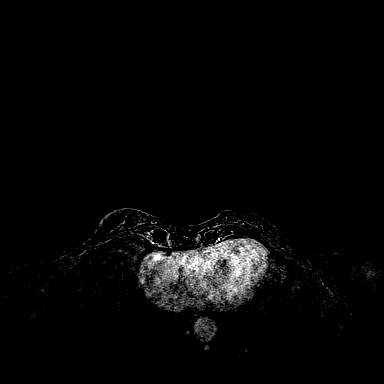
[im 72/144]
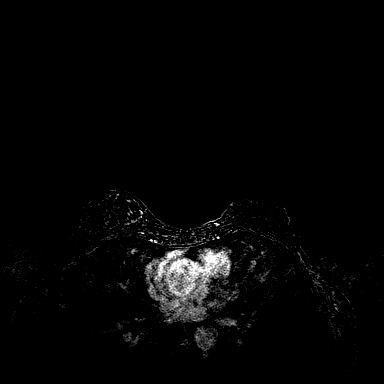
[im 108/144]
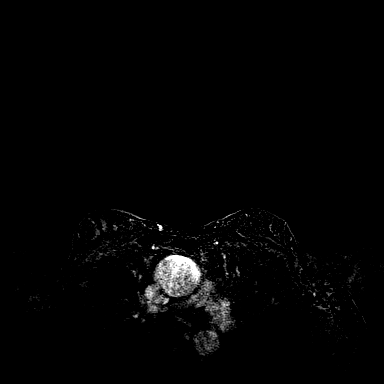
[im 144/144]
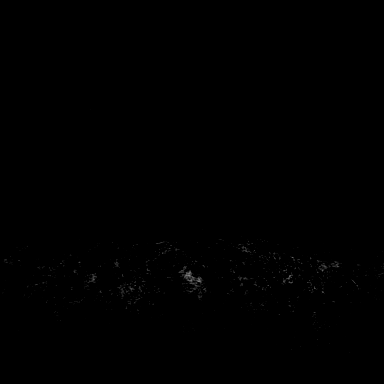

[Series 7: fl3d post-cm 20 · axial · 172.8mm · 0.83mm/px · 1 of 1 slices shown (3 of 3)]
[im 1/1]
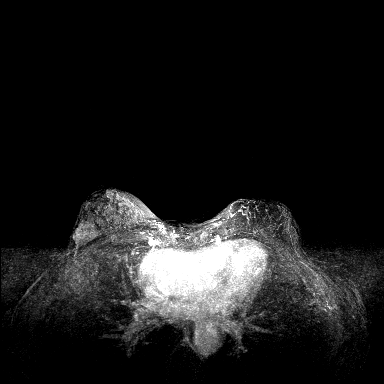

[Series 8: fl3d post-cm 3min · axial · 1.2mm · 0.83mm/px · z∈[-103,+69]mm · 6 of 144 slices shown]
[im 1/144]
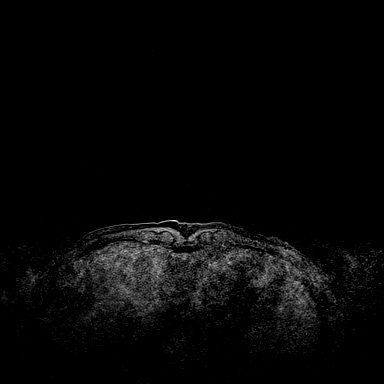
[im 29/144]
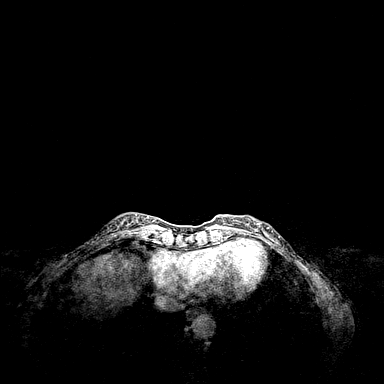
[im 58/144]
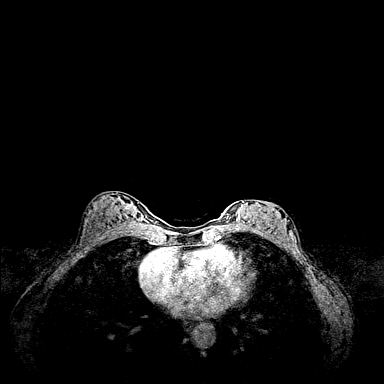
[im 86/144]
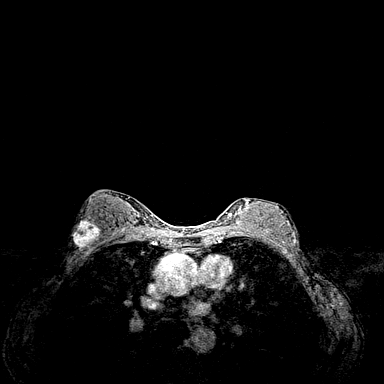
[im 115/144]
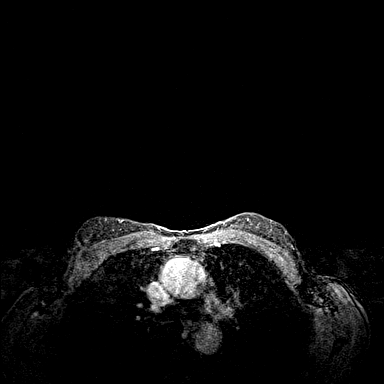
[im 144/144]
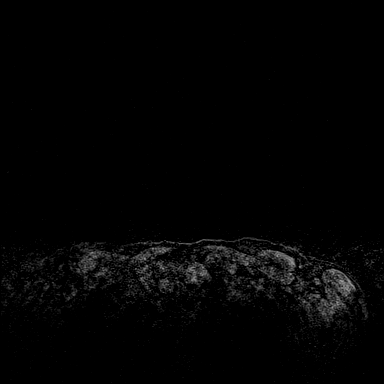

[Series 9: fl3d post-cm 3min_sub · axial · 1.2mm · 0.83mm/px · z∈[-103,-1]mm · 4 of 144 slices shown]
[im 1/144]
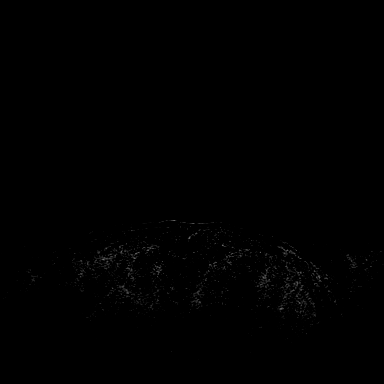
[im 29/144]
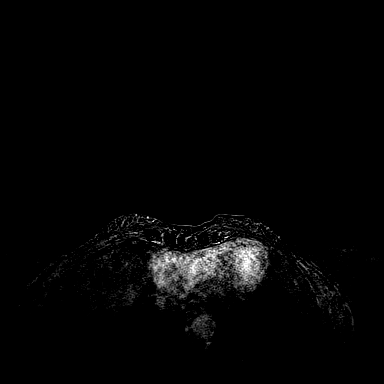
[im 58/144]
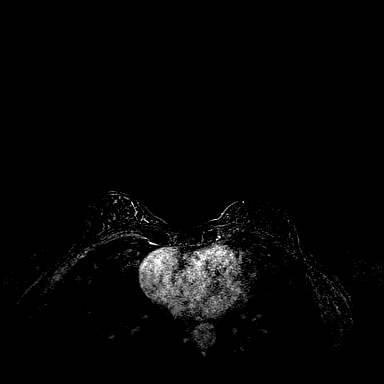
[im 86/144]
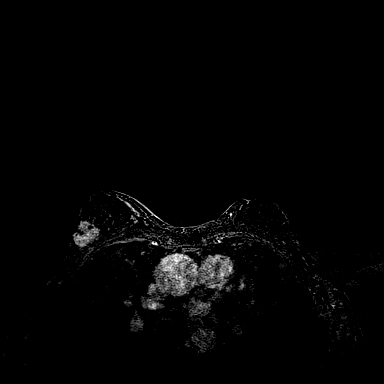

[32 of 48 positions shown; findings below may reference images not displayed]

Three-dimensional MR images were rendered by post-processing of the
original MR data on an independent workstation. The
three-dimensional MR images were interpreted, and findings are
reported in the following complete MRI report for this study. Three
dimensional images were evaluated at the independent interpreting
workstation using the DynaCAD thin client.
FINDINGS: Breast composition: d. Extreme fibroglandular tissue.

Background parenchymal enhancement: Mild

Right breast: There is susceptibility artifact from prior biopsies
in the outer right breast and lower inner right breast. There is an
irregular enhancing mass with mixed kinetics in the outer right
breast measuring 2.2 x 2.0 x 2.7 cm (series 6, image 57), consistent
with biopsy proven malignancy. In the lower inner quadrant there is
mild focal enhancement at the biopsy site (series 6, image 96), as
well as along the biopsy tract, likely representing post biopsy
changes. No additional abnormal areas of enhancement in the right
breast.

Left breast: No mass or abnormal enhancement.

Lymph nodes: There is a possible abnormal lymph node in the right
axilla with cortical thickness measuring 0.7 cm (series 2, image 5),
though visualization is somewhat limited. Normal appearance of the
left axillary lymph nodes.

Ancillary findings:  None.
IMPRESSION: 1. Irregular enhancing mass in the outer right breast measuring
x 2.0 x 2.7 cm consistent with biopsy proven malignancy.
2. There is mild enhancement at the biopsy site in the lower inner
right breast, favored to represent post biopsy changes.
3. Possible abnormal lymph node in the right axilla with cortical
thickness measuring 0.7 cm. Visualization of this area is somewhat
limited.
4. No MRI evidence of malignancy in the left breast.

RECOMMENDATION:
Surgical/oncological management for the known right breast cancer.

BI-RADS CATEGORY  6: Known biopsy-proven malignancy.

## 2021-11-12 DIAGNOSIS — N6321 Unspecified lump in the left breast, upper outer quadrant: Secondary | ICD-10-CM | POA: Diagnosis not present

## 2021-11-12 DIAGNOSIS — Z853 Personal history of malignant neoplasm of breast: Secondary | ICD-10-CM | POA: Diagnosis not present

## 2021-11-21 ENCOUNTER — Encounter: Payer: Self-pay | Admitting: Adult Health

## 2021-11-21 DIAGNOSIS — N6321 Unspecified lump in the left breast, upper outer quadrant: Secondary | ICD-10-CM | POA: Diagnosis not present

## 2021-11-23 ENCOUNTER — Encounter: Payer: Self-pay | Admitting: *Deleted

## 2021-11-23 ENCOUNTER — Other Ambulatory Visit: Payer: Self-pay | Admitting: *Deleted

## 2021-11-23 DIAGNOSIS — C50411 Malignant neoplasm of upper-outer quadrant of right female breast: Secondary | ICD-10-CM

## 2021-11-23 DIAGNOSIS — Z170 Estrogen receptor positive status [ER+]: Secondary | ICD-10-CM

## 2021-11-23 NOTE — Progress Notes (Signed)
Received call from pt stating as of 10/28/21 insurance has changed to [facility] and would need referral placed to establish care with [facility] provider.  RN successfully placed referral and faxed to ([phone]).

## 2021-11-27 DIAGNOSIS — L814 Other melanin hyperpigmentation: Secondary | ICD-10-CM | POA: Diagnosis not present

## 2021-11-27 DIAGNOSIS — L578 Other skin changes due to chronic exposure to nonionizing radiation: Secondary | ICD-10-CM | POA: Diagnosis not present

## 2021-11-27 DIAGNOSIS — L570 Actinic keratosis: Secondary | ICD-10-CM | POA: Diagnosis not present

## 2021-11-27 DIAGNOSIS — L219 Seborrheic dermatitis, unspecified: Secondary | ICD-10-CM | POA: Diagnosis not present

## 2021-11-27 DIAGNOSIS — L821 Other seborrheic keratosis: Secondary | ICD-10-CM | POA: Diagnosis not present

## 2021-11-30 ENCOUNTER — Other Ambulatory Visit: Payer: BC Managed Care – PPO

## 2022-03-29 ENCOUNTER — Telehealth: Payer: Self-pay | Admitting: Hematology and Oncology

## 2022-03-29 NOTE — Telephone Encounter (Signed)
Rescheduled appointment per provider PAL. Left message. 

## 2022-04-09 IMAGING — US US ABDOMEN LIMITED
1 series · 13 of 25 positions shown · non-contrast
Comparison: 10/31/2017 CT abdomen/pelvis

CLINICAL DATA: Right upper quadrant abdominal pain for 1 day.
History of breast cancer.

EXAM:
ULTRASOUND ABDOMEN LIMITED RIGHT UPPER QUADRANT

[Series 1: us abdomen limited ruq (liver/gb) · 13 of 60 slices shown]
[im 1/60]
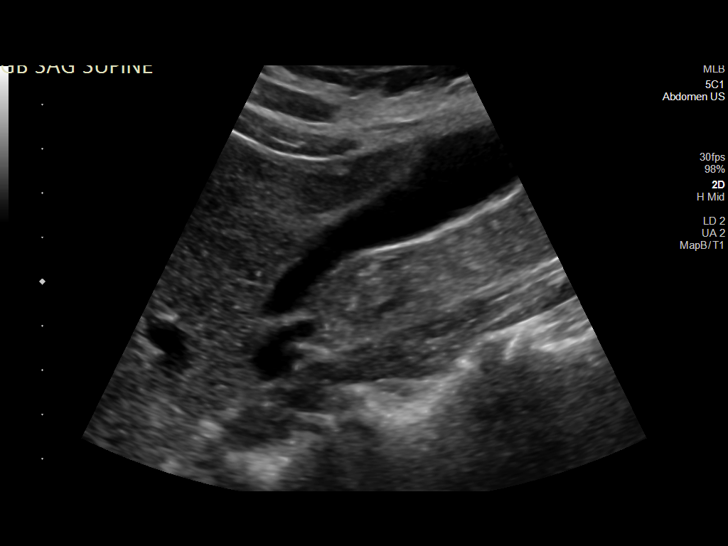
[im 5/60]
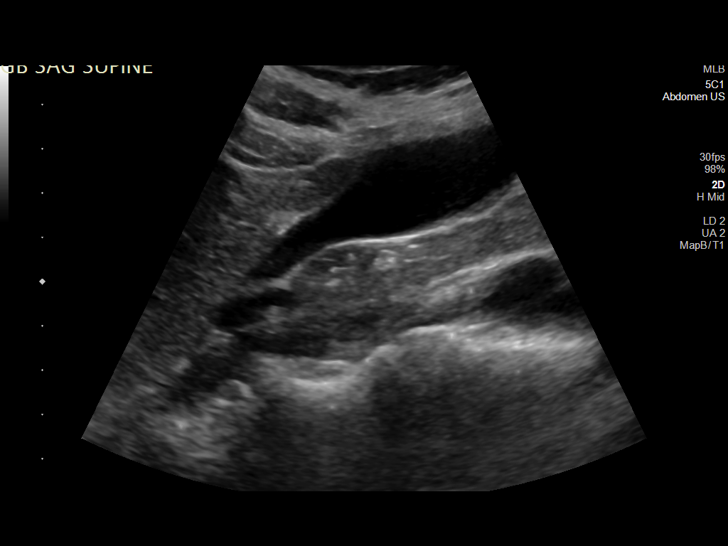
[im 10/60]
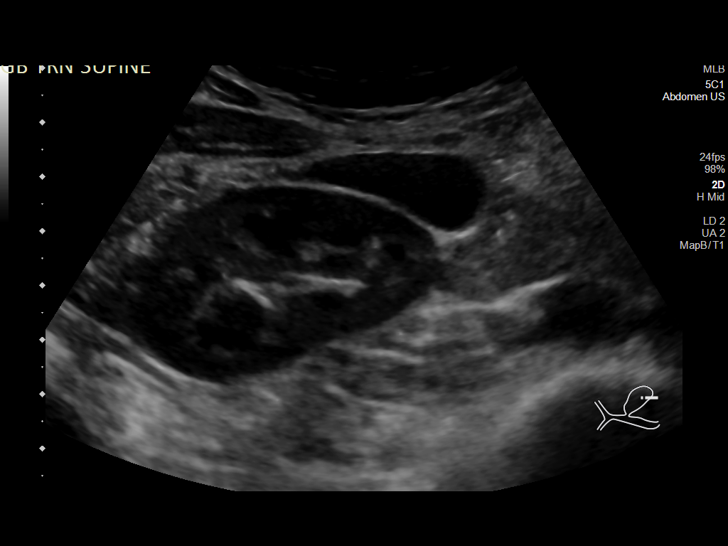
[im 15/60]
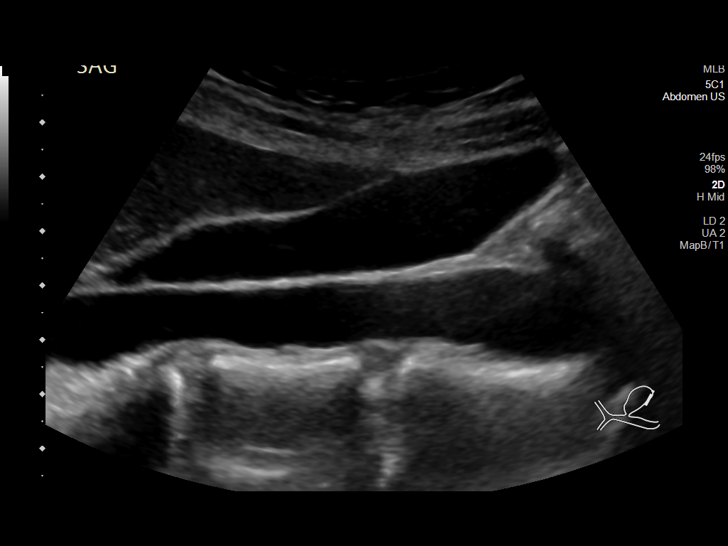
[im 20/60]
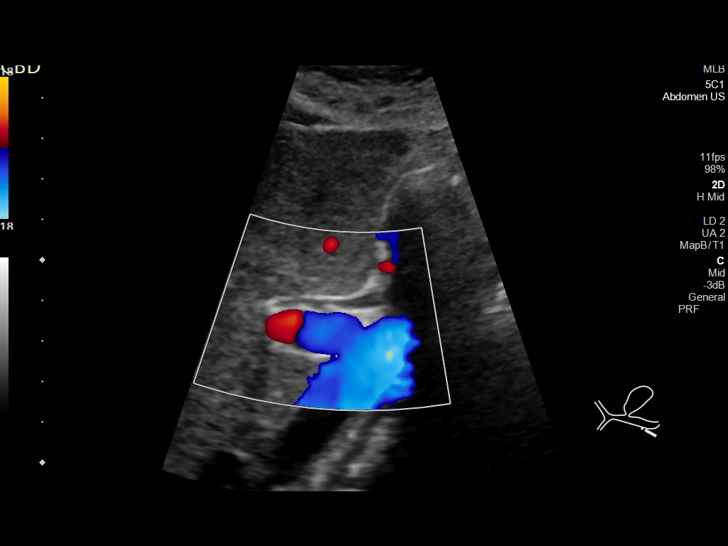
[im 25/60]
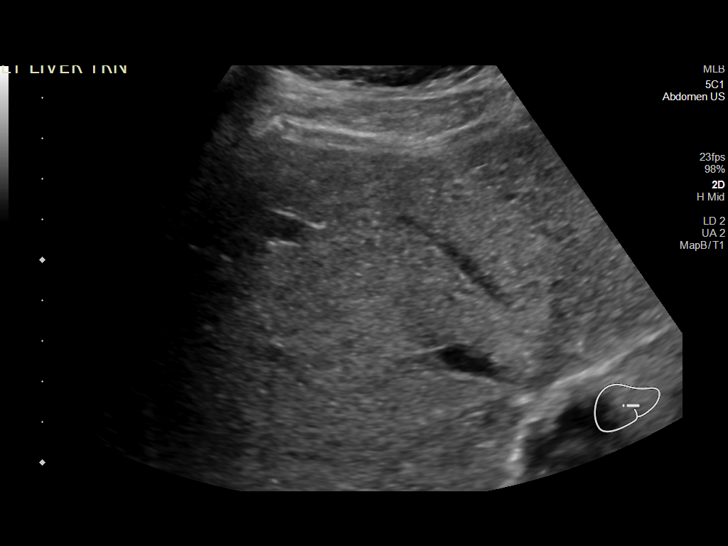
[im 30/60]
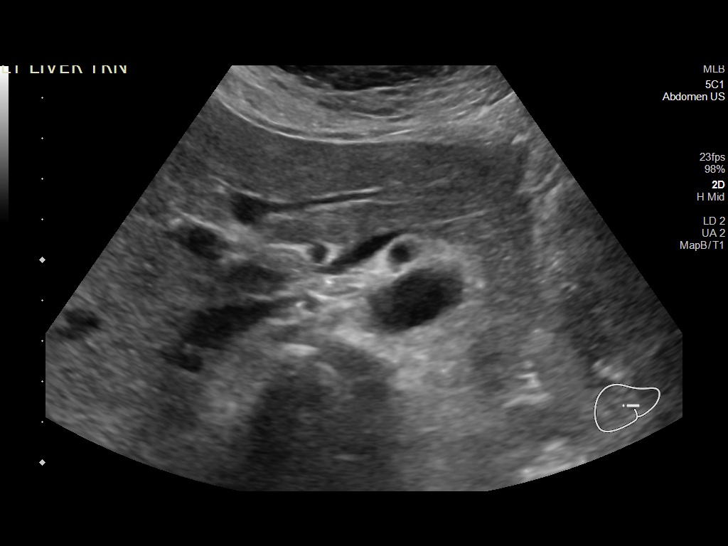
[im 35/60]
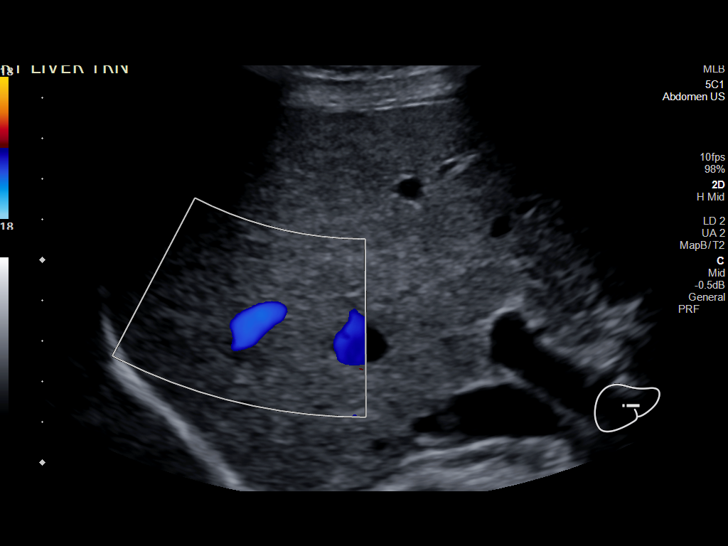
[im 40/60]
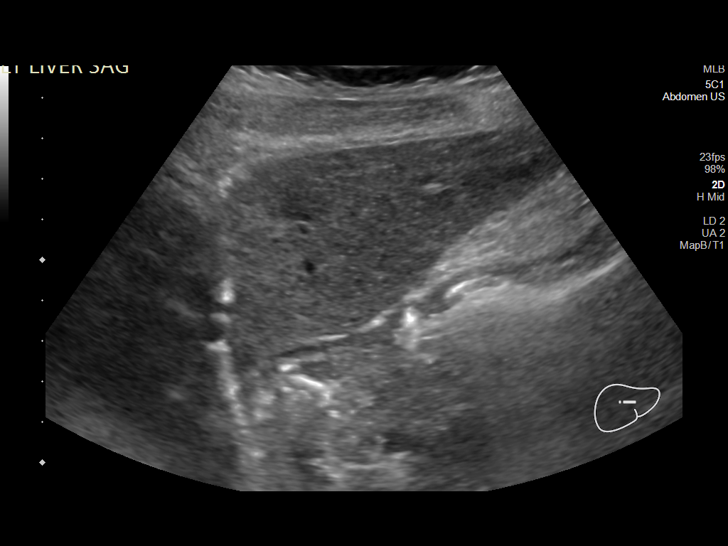
[im 45/60]
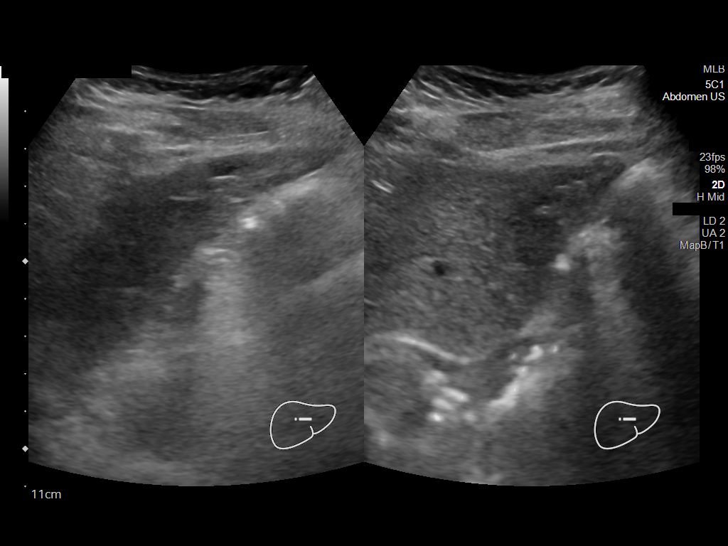
[im 50/60]
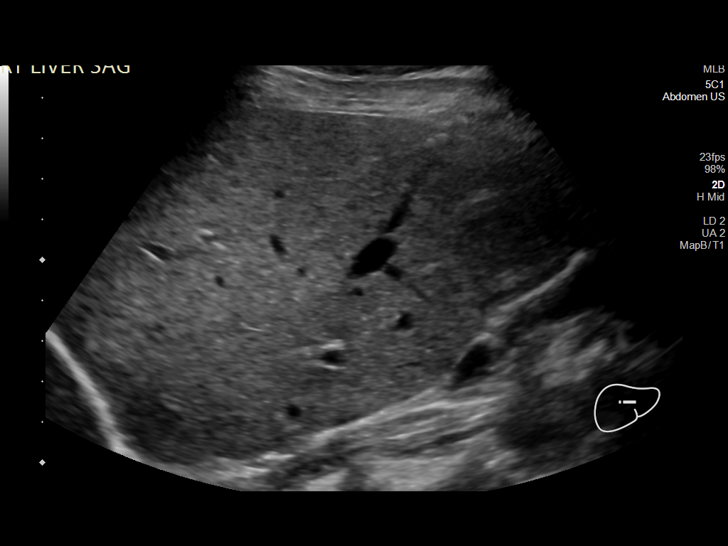
[im 55/60]
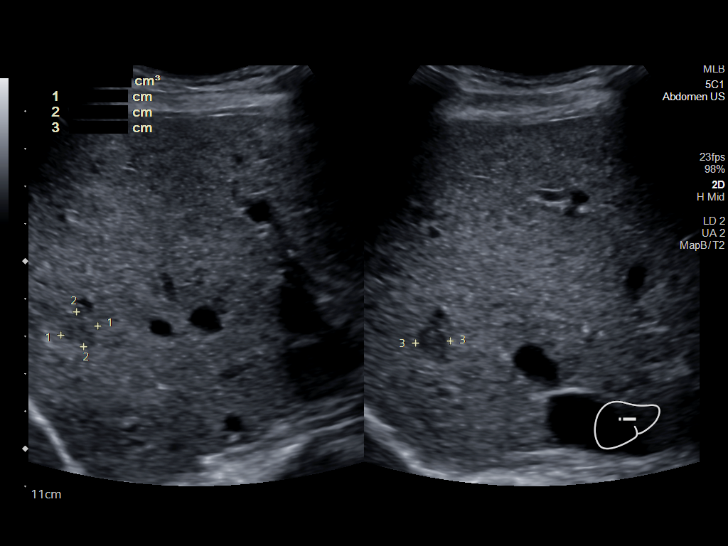
[im 60/60]
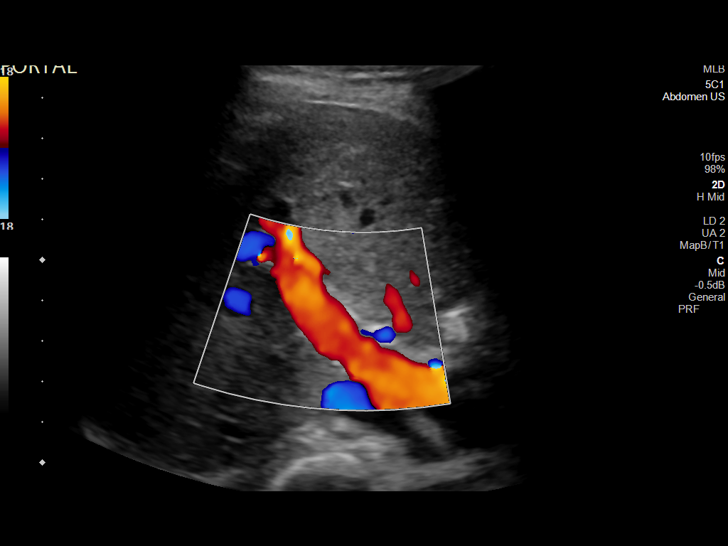

[13 of 25 positions shown; findings below may reference images not displayed]

FINDINGS: Gallbladder:

No gallstones or wall thickening visualized. No sonographic Murphy
sign noted by sonographer.

Common bile duct:

Diameter: 2 mm

Liver:

Small 1.2 x 1.1 x 0.7 cm left liver cyst. Small 0.8 x 0.4 x 0.4 cm
lateral segment left liver cyst. Hypoechoic 1.0 x 1.0 x 0.9 cm
posterior superior right liver lesion, which probably correlates
with a hypodense 1.2 cm lesion seen in the segment 7 right liver on
10/31/2017 CT abdomen/pelvis study. Background liver parenchymal
echogenicity and echotexture are normal. Portal vein is patent on
color Doppler imaging with normal direction of blood flow towards
the liver.

Other: None.
IMPRESSION: 1. No acute abnormality. No cholelithiasis. No biliary ductal
dilatation.
2. Hypoechoic 1.0 cm posterosuperior right liver lesion,
indeterminate, although probably correlating with a hypodense 1.2 cm
lesion seen in the segment 7 right liver on 10/31/2017 CT
abdomen/pelvis study, favoring a benign lesion. Additional small
benign-appearing left liver cysts. Given the history of breast
cancer, outpatient MRI abdomen without and with IV contrast may be
considered for further evaluation, as clinically warranted.

## 2022-04-09 IMAGING — CT CT ABD-PELV W/ CM
2 of 5 series · 16 of 46 positions shown, 18 images · IV contrast (omnipaque)
Comparison: October 31, 2017.

CLINICAL DATA: Acute generalized abdominal pain.

EXAM:
CT ABDOMEN AND PELVIS WITH CONTRAST
TECHNIQUE: Multidetector CT imaging of the abdomen and pelvis was performed
using the standard protocol following bolus administration of
intravenous contrast.
CONTRAST:  60mL OMNIPAQUE IOHEXOL 300 MG/ML  SOLN

[Series 2: abd pel w · axial · 0.62mm/px · z∈[-324,+26]mm · 13 of 80 slices shown, 15 images]
[im 5/80  soft-tissue]
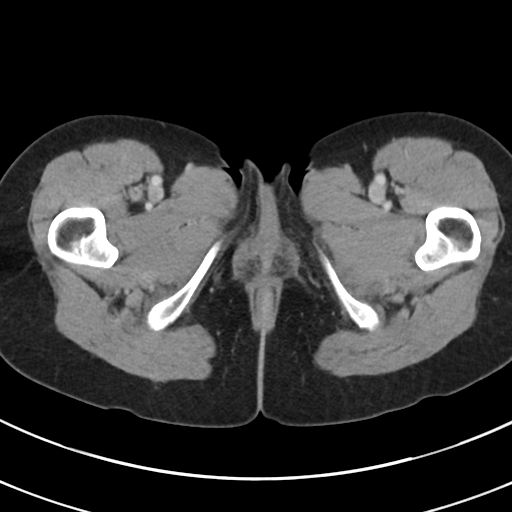
[im 5/80  bone]
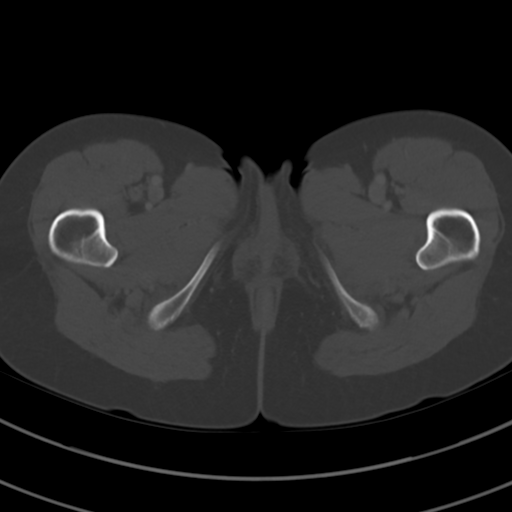
[im 10/80  soft-tissue]
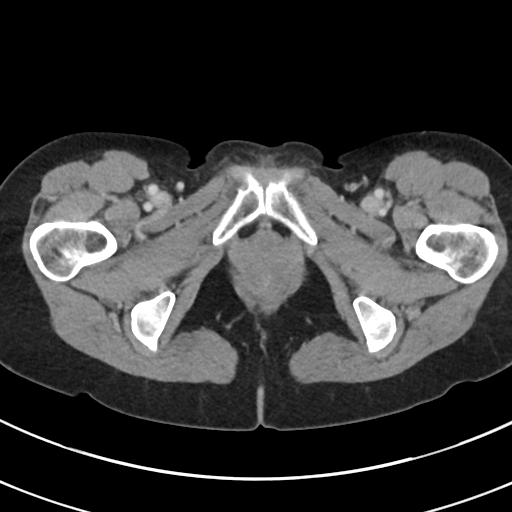
[im 19/80  soft-tissue]
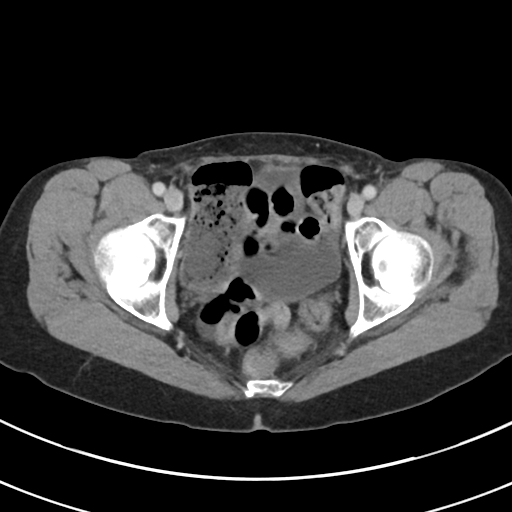
[im 24/80  soft-tissue]
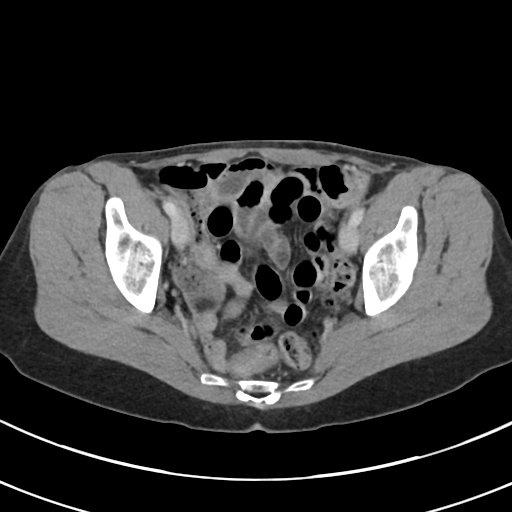
[im 28/80  soft-tissue]
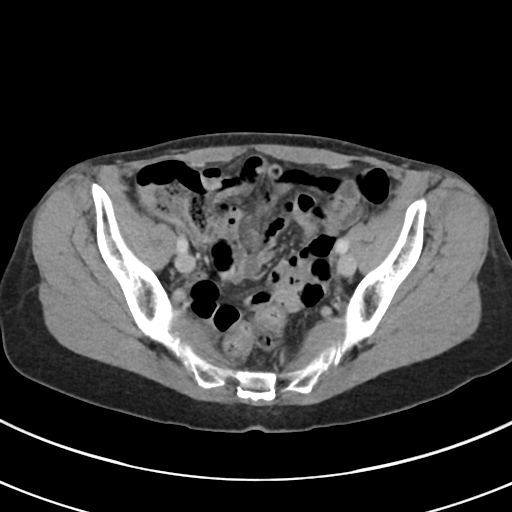
[im 33/80  soft-tissue]
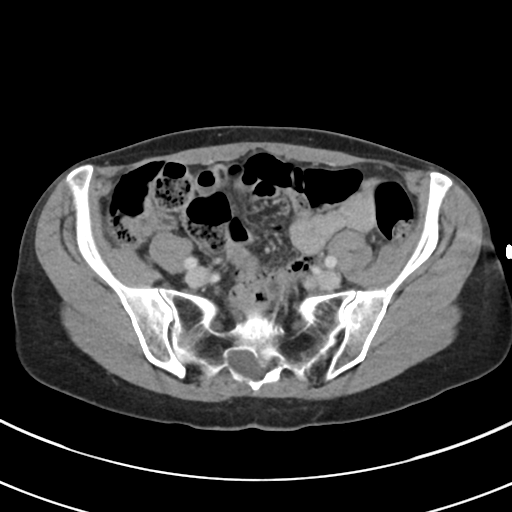
[im 42/80  soft-tissue]
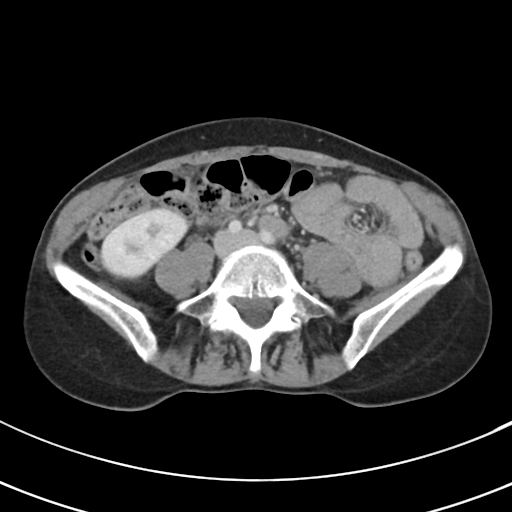
[im 47/80  soft-tissue]
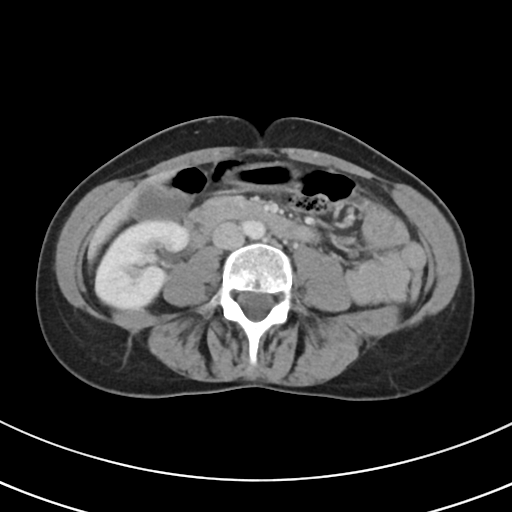
[im 52/80  soft-tissue]
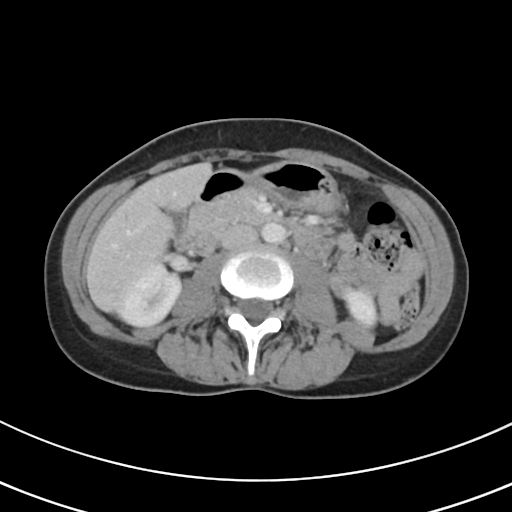
[im 52/80  bone]
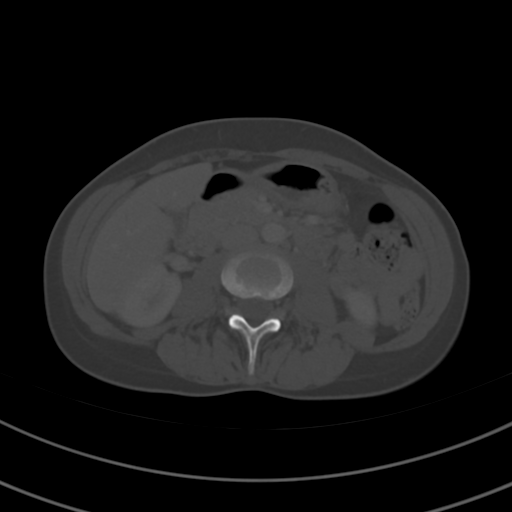
[im 56/80  soft-tissue]
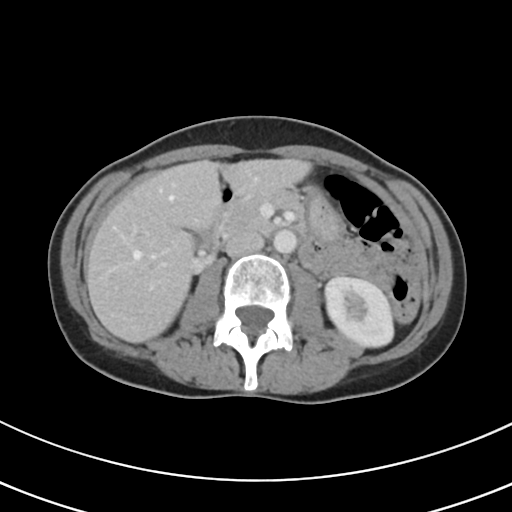
[im 61/80  soft-tissue]
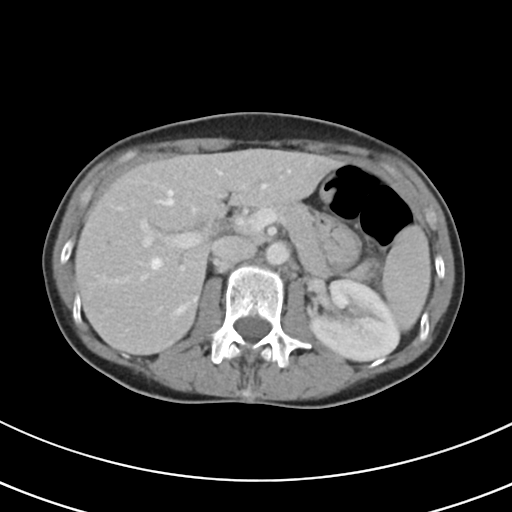
[im 70/80  soft-tissue]
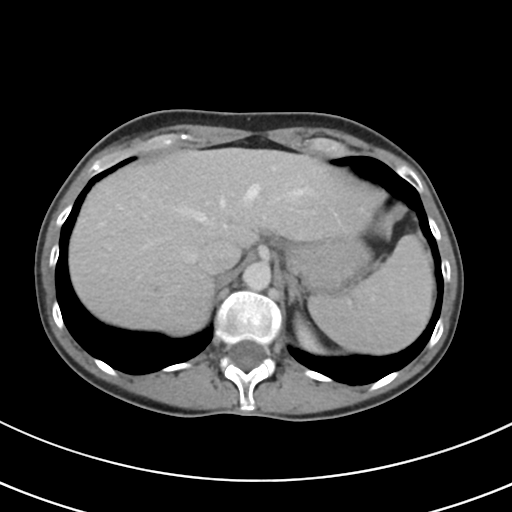
[im 75/80  soft-tissue]
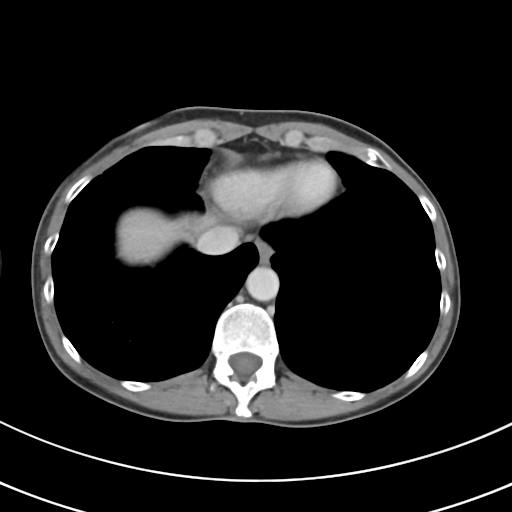

[Series 5: coronal · coronal · 0.64mm/px · 3 of 73 slices shown]
[im 25/73  soft-tissue]
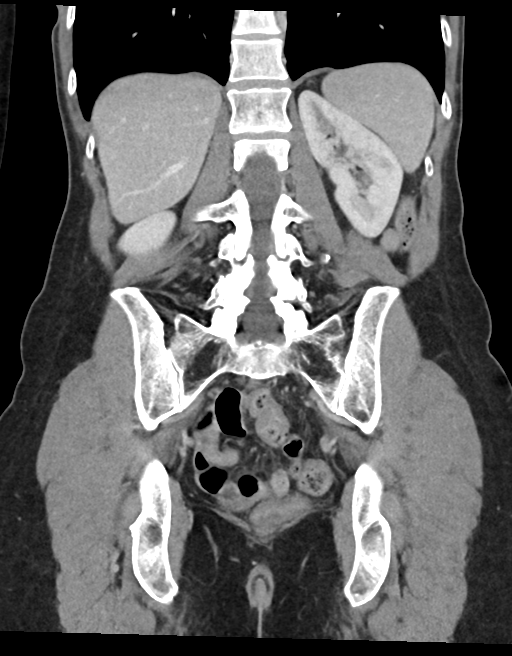
[im 33/73  soft-tissue]
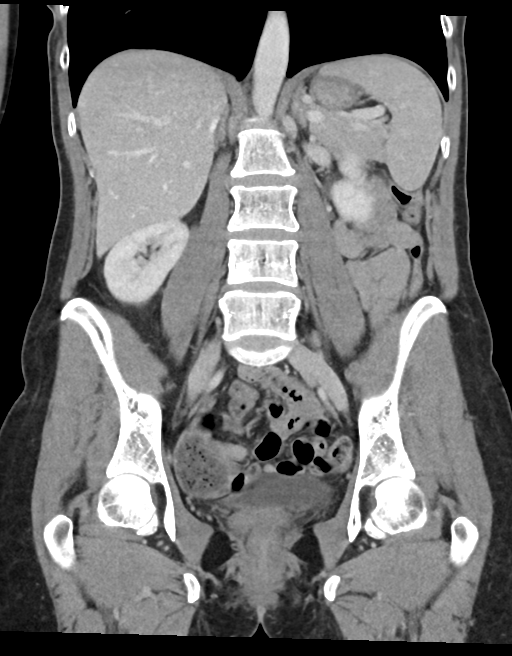
[im 41/73  soft-tissue]
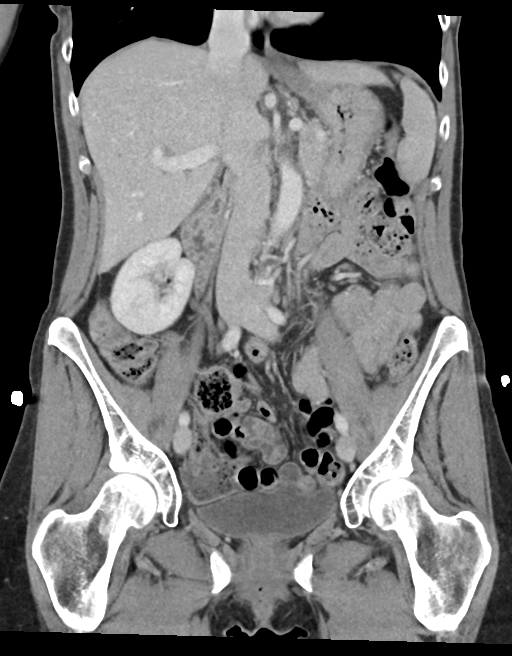

[16 of 46 positions shown; findings below may reference images not displayed]

FINDINGS: Lower chest: No acute abnormality.

Hepatobiliary: No gallstones are noted. No biliary dilatation is
noted. Stable hepatic cysts are noted.

Pancreas: Unremarkable. No pancreatic ductal dilatation or
surrounding inflammatory changes.

Spleen: Normal in size without focal abnormality.

Adrenals/Urinary Tract: Adrenal glands are unremarkable. Kidneys are
normal, without renal calculi, focal lesion, or hydronephrosis.
Bladder is unremarkable.

Stomach/Bowel: Stomach is within normal limits. Appendix appears
normal. No evidence of bowel wall thickening, distention, or
inflammatory changes.

Vascular/Lymphatic: No significant vascular findings are present. No
enlarged abdominal or pelvic lymph nodes.

Reproductive: Status post hysterectomy. No adnexal masses.

Other: No abdominal wall hernia or abnormality. No abdominopelvic
ascites.

Musculoskeletal: No acute or significant osseous findings.
IMPRESSION: No acute abnormality seen in the abdomen or pelvis.

## 2022-04-19 ENCOUNTER — Ambulatory Visit: Payer: BC Managed Care – PPO | Admitting: Hematology and Oncology

## 2022-05-10 ENCOUNTER — Inpatient Hospital Stay: Payer: BC Managed Care – PPO | Attending: Hematology and Oncology | Admitting: Hematology and Oncology

## 2022-05-10 NOTE — Assessment & Plan Note (Deleted)
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 Breast conserving surgery6/16/22: Grade 2 IDC 2.6 cm, 1/2 LN positive, Margins Neg,ER 90%, PR 95%, Ki-67 10%, HER2 negative by FISH 2.Mammaprinttesting: Low risk 3. Adjuvant radiation therapy 05/31/2021-07/09/2021 4. Adjuvant antiestrogen therapy with letrozole 2.5 mg daily started 07/24/2019. --------------------------------------------------------------------------------------------------------------------- Letrozole toxicities:  Breast cancer surveillance: 1.  Breast exam 05/10/2022: Benign 2. mammogram 11/21/2021: [facility] 0.9 cm mass in the left breast 2 o'clock position 1 cm from nipple: Ultrasound-guided biopsy: Fibroadenoma  Return to clinic in 1 year for follow-up

## 2022-07-22 ENCOUNTER — Encounter: Payer: Self-pay | Admitting: *Deleted

## 2022-09-26 ENCOUNTER — Encounter: Payer: Self-pay | Admitting: Gastroenterology

## 2022-10-10 ENCOUNTER — Encounter: Payer: Self-pay | Admitting: *Deleted

## 2025-02-07 ENCOUNTER — Ambulatory Visit: Admitting: Neurology
